# Patient Record
Sex: Female | Born: 1950
Health system: Southern US, Community
[De-identification: ages and names within clinical notes are randomized; demographics above are authoritative.]

## PROBLEM LIST (undated history)

## (undated) DIAGNOSIS — E785 Hyperlipidemia, unspecified: Secondary | ICD-10-CM

## (undated) DIAGNOSIS — I10 Essential (primary) hypertension: Secondary | ICD-10-CM

## (undated) DIAGNOSIS — I739 Peripheral vascular disease, unspecified: Secondary | ICD-10-CM

## (undated) DIAGNOSIS — K219 Gastro-esophageal reflux disease without esophagitis: Secondary | ICD-10-CM

## (undated) HISTORY — PX: TUBAL LIGATION: SHX77

---

## 2006-04-23 LAB — HM COLONOSCOPY: HM COLON: NORMAL

## 2006-08-05 ENCOUNTER — Ambulatory Visit: Payer: Self-pay | Admitting: Gastroenterology

## 2006-08-05 HISTORY — PX: COLONOSCOPY: SHX174

## 2006-09-26 ENCOUNTER — Ambulatory Visit: Payer: Self-pay | Admitting: Emergency Medicine

## 2007-11-02 ENCOUNTER — Ambulatory Visit: Payer: Self-pay | Admitting: Family Medicine

## 2010-08-16 ENCOUNTER — Ambulatory Visit: Payer: Self-pay | Admitting: Internal Medicine

## 2011-08-24 ENCOUNTER — Ambulatory Visit: Payer: Self-pay | Admitting: Internal Medicine

## 2012-10-01 ENCOUNTER — Ambulatory Visit: Payer: Self-pay | Admitting: Internal Medicine

## 2012-10-07 ENCOUNTER — Ambulatory Visit: Payer: Self-pay | Admitting: Internal Medicine

## 2013-10-21 LAB — HM MAMMOGRAPHY: HM Mammogram: NORMAL

## 2013-11-19 ENCOUNTER — Ambulatory Visit: Payer: Self-pay | Admitting: Internal Medicine

## 2014-10-22 ENCOUNTER — Encounter: Payer: Self-pay | Admitting: Internal Medicine

## 2014-10-22 DIAGNOSIS — IMO0001 Reserved for inherently not codable concepts without codable children: Secondary | ICD-10-CM | POA: Insufficient documentation

## 2014-10-22 DIAGNOSIS — I10 Essential (primary) hypertension: Secondary | ICD-10-CM | POA: Insufficient documentation

## 2014-10-22 DIAGNOSIS — J3089 Other allergic rhinitis: Secondary | ICD-10-CM | POA: Insufficient documentation

## 2014-10-22 DIAGNOSIS — E782 Mixed hyperlipidemia: Secondary | ICD-10-CM | POA: Insufficient documentation

## 2014-12-10 ENCOUNTER — Encounter: Payer: Self-pay | Admitting: Internal Medicine

## 2015-02-22 ENCOUNTER — Encounter: Payer: Self-pay | Admitting: Internal Medicine

## 2015-02-23 ENCOUNTER — Encounter: Payer: Self-pay | Admitting: Internal Medicine

## 2015-02-23 ENCOUNTER — Ambulatory Visit (INDEPENDENT_AMBULATORY_CARE_PROVIDER_SITE_OTHER): Payer: Medicare HMO | Admitting: Internal Medicine

## 2015-02-23 VITALS — BP 145/80 | HR 64 | Ht 60.5 in | Wt 128.5 lb

## 2015-02-23 DIAGNOSIS — F17200 Nicotine dependence, unspecified, uncomplicated: Secondary | ICD-10-CM

## 2015-02-23 DIAGNOSIS — Z1239 Encounter for other screening for malignant neoplasm of breast: Secondary | ICD-10-CM | POA: Diagnosis not present

## 2015-02-23 DIAGNOSIS — N63 Unspecified lump in breast: Secondary | ICD-10-CM | POA: Diagnosis not present

## 2015-02-23 DIAGNOSIS — I739 Peripheral vascular disease, unspecified: Secondary | ICD-10-CM

## 2015-02-23 DIAGNOSIS — I10 Essential (primary) hypertension: Secondary | ICD-10-CM | POA: Diagnosis not present

## 2015-02-23 DIAGNOSIS — Z0000 Encounter for general adult medical examination without abnormal findings: Secondary | ICD-10-CM | POA: Diagnosis not present

## 2015-02-23 DIAGNOSIS — E7849 Other hyperlipidemia: Secondary | ICD-10-CM

## 2015-02-23 DIAGNOSIS — E784 Other hyperlipidemia: Secondary | ICD-10-CM | POA: Diagnosis not present

## 2015-02-23 DIAGNOSIS — N6310 Unspecified lump in the right breast, unspecified quadrant: Secondary | ICD-10-CM

## 2015-02-23 HISTORY — DX: Nicotine dependence, unspecified, uncomplicated: F17.200

## 2015-02-23 LAB — POCT URINALYSIS DIPSTICK
BILIRUBIN UA: NEGATIVE
Blood, UA: NEGATIVE
GLUCOSE UA: NEGATIVE
Ketones, UA: NEGATIVE
Leukocytes, UA: NEGATIVE
Nitrite, UA: NEGATIVE
Protein, UA: NEGATIVE
Spec Grav, UA: 1.02
UROBILINOGEN UA: 0.2
pH, UA: 5

## 2015-02-23 MED ORDER — LISINOPRIL 10 MG PO TABS: 10.0000 mg | ORAL_TABLET | Freq: Every day | ORAL | Status: DC

## 2015-02-23 NOTE — Progress Notes (Signed)
Patient: Angela Herrera, Female    DOB: 24-Nov-1950, 64 y.o.   MRN: [id number] Visit Date: 02/23/2015  Today's Provider: Halina Maidens, MD   Chief Complaint  Patient presents with  . Medicare Wellness    initial visit  . Hypertension   Subjective:   Initial preventative physical exam Angela Herrera is a 64 y.o. female who presents today for her Initial Preventative Physical Exam. She feels well. She reports exercising walking daily. She reports she is sleeping fairly well. Still having some night sweats.She denies any breast problems.  Hypertension This is a chronic problem. The current episode started more than 1 year ago. The problem is unchanged. The problem is controlled. Pertinent negatives include no chest pain, headaches, palpitations or shortness of breath. The current treatment provides significant improvement. There are no compliance problems.  There is no history of kidney disease or heart failure. There is no history of chronic renal disease.  Peripheral vascular disease - She has minimal symptoms of lower extremity pain.  She continues to smoke but is cutting back. She remains active walking daily.  She has been seen by vascular surgery and no surgery recommended.  Review of Systems  Constitutional: Negative for fever, chills and fatigue.  HENT: Negative for hearing loss, sinus pressure, trouble swallowing and voice change.   Eyes: Negative for visual disturbance.  Respiratory: Negative for cough, chest tightness and shortness of breath.   Cardiovascular: Negative for chest pain, palpitations and leg swelling.  Gastrointestinal: Negative for nausea, abdominal pain, diarrhea and blood in stool.  Genitourinary: Negative for dysuria, urgency and vaginal discharge.  Musculoskeletal: Negative for joint swelling, arthralgias and gait problem.  Neurological: Negative for weakness, light-headedness, numbness and headaches.  Hematological: Negative for adenopathy.   Psychiatric/Behavioral: Positive for sleep disturbance (night sweats intermittently). Negative for dysphoric mood and decreased concentration.    Social History   Social History  . Marital Status: Married    Spouse Name: N/A  . Number of Children: N/A  . Years of Education: N/A   Occupational History  . Not on file.   Social History Main Topics  . Smoking status: Current Every Day Smoker  . Smokeless tobacco: Not on file  . Alcohol Use: 0.0 oz/week    0 Standard drinks or equivalent per week  . Drug Use: No  . Sexual Activity: Not on file   Other Topics Concern  . Not on file   Social History Narrative    Patient Active Problem List   Diagnosis Date Noted  . Familial multiple lipoprotein-type hyperlipidemia 10/22/2014  . Environmental and seasonal allergies 10/22/2014  . Charcot's syndrome ([place]) 10/22/2014  . Hypertension 10/22/2014    Past Surgical History  Procedure Laterality Date  . Tubal ligation Bilateral     Her family history includes Hypertension in her mother; Stroke in her mother.    Previous Medications   ASPIRIN 81 MG TABLET    Take 81 mg by mouth daily.   VITAMIN B-12 (CYANOCOBALAMIN) 1000 MCG TABLET    Take 1,000 mcg by mouth daily.    Patient Care Team: Glean Hess, MD as PCP - General (Family Medicine)     Objective:   Vitals: BP 145/80 mmHg  Pulse 64  Ht 5' 0.5" (1.537 m)  Wt 128 lb 8 oz (58.287 kg)  BMI 24.67 kg/m2  Physical Exam  Constitutional: She is oriented to person, place, and time. She appears well-developed and well-nourished. No distress.  HENT:  Head: Normocephalic and atraumatic.  Right Ear: Tympanic membrane and ear canal normal.  Left Ear: Tympanic membrane and ear canal normal.  Nose: Right sinus exhibits no maxillary sinus tenderness. Left sinus exhibits no maxillary sinus tenderness.  Mouth/Throat: Uvula is midline and oropharynx is clear and moist.  Eyes: Conjunctivae and EOM are normal. Right eye exhibits  no discharge. Left eye exhibits no discharge. No scleral icterus.  Neck: Normal range of motion. Carotid bruit is not present. No erythema present. No thyromegaly present.  Cardiovascular: Normal rate, regular rhythm and normal heart sounds.   Pulses:      Dorsalis pedis pulses are 1+ on the right side, and 1+ on the left side.       Posterior tibial pulses are 1+ on the right side, and 1+ on the left side.  Pulmonary/Chest: Effort normal. No respiratory distress. She has no wheezes. Right breast exhibits mass (at 5,6,7 oclock). Right breast exhibits no nipple discharge, no skin change and no tenderness. Left breast exhibits no mass, no nipple discharge, no skin change and no tenderness.  Abdominal: Soft. Bowel sounds are normal. There is no hepatosplenomegaly. There is no tenderness. There is no CVA tenderness.  Musculoskeletal: Normal range of motion.  Lymphadenopathy:    She has no cervical adenopathy.    She has no axillary adenopathy.  Neurological: She is alert and oriented to person, place, and time. She has normal strength and normal reflexes. No cranial nerve deficit or sensory deficit.  Skin: Skin is warm, dry and intact. No rash noted.  Psychiatric: She has a normal mood and affect. Her speech is normal and behavior is normal. Thought content normal. Cognition and memory are normal.  Nursing note and vitals reviewed.    No exam data present  Activities of Daily Living In your present state of health, do you have any difficulty performing the following activities: 02/23/2015  Hearing? N  Vision? N  Difficulty concentrating or making decisions? N  Walking or climbing stairs? N  Dressing or bathing? N  Doing errands, shopping? N    Fall Risk Assessment Fall Risk  02/23/2015  Falls in the past year? No     Patient reports there are safety devices in place in shower at home.   Depression Screen PHQ 2/9 Scores 02/23/2015  PHQ - 2 Score 0    Cognitive Testing - 6-CIT    Correct? Score   What year is it? yes 0 Yes = 0    No = 4  What month is it? yes 0 Yes = 0    No = 3  Remember:     Pia [address]au, [place], [place]     What time is it? yes 0 Yes = 0    No = 3  Count backwards from 20 to 1 yes 0 Correct = 0    1 error = 2   More than 1 error = 4  Say the months of the year in reverse. yes 0 Correct = 0    1 error = 2   More than 1 error = 4  What address did I ask you to remember? yes 0 Correct = 0  1 error = 2    2 error = 4    3 error = 6    4 error = 8    All wrong = 10       TOTAL SCORE  0/28   Interpretation:  Normal  Normal (0-7) Abnormal (8-28)  Assessment & Plan:     Initial Preventative Physical Exam  Reviewed patient's Family Medical History Reviewed and updated list of patient's medical providers Assessment of cognitive impairment was done Assessed patient's functional ability Established a written schedule for health screening [place] Completed and Reviewed  Exercise Activities and Dietary recommendations Goals    None      Immunization History  Administered Date(s) Administered  . Pneumococcal-Unspecified 09/24/2012  . Zoster 12/08/2013    Health Maintenance  Topic Date Due  . Hepatitis C Screening  February 08, 1951  . HIV Screening  06/25/1964  . TETANUS/TDAP  06/25/1968  . PAP SMEAR  06/26/1970  . DEXA SCAN  06/26/2014  . PNA vac Low Risk Adult (1 of 2 - PCV13) 06/26/2014  . INFLUENZA VACCINE  11/22/2014  . MAMMOGRAM  10/22/2015  . COLONOSCOPY  04/23/2016  . ZOSTAVAX  Completed      Discussed health benefits of physical activity, and encouraged her to engage in regular exercise appropriate for her age and condition.    ------------------------------------------------------------------------------------------------------------  1. Medicare annual wellness visit, initial Patient is given information regarding advanced directives She'll call her insurance for coverage on tetanus and  Prevnar vaccines - POCT urinalysis dipstick - EKG 12-Lead - SR with borderline voltage criteria for LVH  2. Essential hypertension Fair control; continue current medication - CBC with Differential/Platelet - Comprehensive metabolic panel - TSH - lisinopril (PRINIVIL,ZESTRIL) 10 MG tablet; Take 1 tablet (10 mg total) by mouth daily.  Dispense: 90 tablet; Refill: 3  3. Familial multiple lipoprotein-type hyperlipidemia Continue healthy diet and regular exercise - Lipid panel  4. Mass of breast, right Noted on exam today - MM Digital Diagnostic Bilat; Future - US BREAST LTD UNI RIGHT INC AXILLA; Future - US BREAST LTD UNI LEFT INC AXILLA; Future  6. Peripheral vascular disease ([place]) Seen by Vascular surgery - only recommended exercise Pt is not ready to quit smoking at this time Continue aspirin  Halina Maidens, MD [facility]  02/23/2015

## 2015-02-23 NOTE — Patient Instructions (Addendum)
Health Maintenance  Topic Date Due  . Hepatitis C Screening  1951/01/05  . HIV Screening  06/25/1964  . TETANUS/TDAP  Due now  . PAP SMEAR  No longer needed  . DEXA SCAN  06/26/2014  . PNA vac Low Risk Adult (1 of 2 - PCV13) Prevar-13 due now  . INFLUENZA VACCINE  11/22/2014  . MAMMOGRAM  Yearly  . COLONOSCOPY  04/23/2016  . ZOSTAVAX  Completed   Breast Self-Awareness Practicing breast self-awareness may pick up problems early, prevent significant medical complications, and possibly save your life. By practicing breast self-awareness, you can become familiar with how your breasts look and feel and if your breasts are changing. This allows you to notice changes early. It can also offer you some reassurance that your breast health is good. One way to learn what is normal for your breasts and whether your breasts are changing is to do a breast self-exam. If you find a lump or something that was not present in the past, it is best to contact your caregiver right away. Other findings that should be evaluated by your caregiver include nipple discharge, especially if it is bloody; skin changes or reddening; areas where the skin seems to be pulled in (retracted); or new lumps and bumps. Breast pain is seldom associated with cancer (malignancy), but should also be evaluated by a caregiver. HOW TO PERFORM A BREAST SELF-EXAM The best time to examine your breasts is 5-7 days after your menstrual period is over. During menstruation, the breasts are lumpier, and it may be more difficult to pick up changes. If you do not menstruate, have reached menopause, or had your uterus removed (hysterectomy), you should examine your breasts at regular intervals, such as monthly. If you are breastfeeding, examine your breasts after a feeding or after using a breast pump. Breast implants do not decrease the risk for lumps or tumors, so continue to perform breast self-exams as recommended. Talk to your caregiver about how to  determine the difference between the implant and breast tissue. Also, talk about the amount of pressure you should use during the exam. Over time, you will become more familiar with the variations of your breasts and more comfortable with the exam. A breast self-exam requires you to remove all your clothes above the waist. 1. Look at your breasts and nipples. Stand in front of a mirror in a room with good lighting. With your hands on your hips, push your hands firmly downward. Look for a difference in shape, contour, and size from one breast to the other (asymmetry). Asymmetry includes puckers, dips, or bumps. Also, look for skin changes, such as reddened or scaly areas on the breasts. Look for nipple changes, such as discharge, dimpling, repositioning, or redness. 2. Carefully feel your breasts. This is best done either in the shower or tub while using soapy water or when flat on your back. Place the arm (on the side of the breast you are examining) above your head. Use the pads (not the fingertips) of your three middle fingers on your opposite hand to feel your breasts. Start in the underarm area and use  inch (2 cm) overlapping circles to feel your breast. Use 3 different levels of pressure (light, medium, and firm pressure) at each circle before moving to the next circle. The light pressure is needed to feel the tissue closest to the skin. The medium pressure will help to feel breast tissue a little deeper, while the firm pressure is needed to feel  the tissue close to the ribs. Continue the overlapping circles, moving downward over the breast until you feel your ribs below your breast. Then, move one finger-width towards the center of the body. Continue to use the  inch (2 cm) overlapping circles to feel your breast as you move slowly up toward the collar bone (clavicle) near the base of the neck. Continue the up and down exam using all 3 pressures until you reach the middle of the chest. Do this with each  breast, carefully feeling for lumps or changes. 3.  Keep a written record with breast changes or normal findings for each breast. By writing this information down, you do not need to depend only on memory for size, tenderness, or location. Write down where you are in your menstrual cycle, if you are still menstruating. Breast tissue can have some lumps or thick tissue. However, see your caregiver if you find anything that concerns you.  SEEK MEDICAL CARE IF:  You see a change in shape, contour, or size of your breasts or nipples.   You see skin changes, such as reddened or scaly areas on the breasts or nipples.   You have an unusual discharge from your nipples.   You feel a new lump or unusually thick areas.    This information is not intended to replace advice given to you by your health care provider. Make sure you discuss any questions you have with your health care provider.   Document Released: 04/09/2005 Document Revised: 03/26/2012 Document Reviewed: 07/25/2011 Elsevier Interactive Patient Education [employer].

## 2015-02-24 ENCOUNTER — Other Ambulatory Visit: Payer: Self-pay | Admitting: Internal Medicine

## 2015-02-24 LAB — LIPID PANEL
CHOL/HDL RATIO: 4.9 ratio — AB (ref 0.0–4.4)
Cholesterol, Total: 253 mg/dL — ABNORMAL HIGH (ref 100–199)
HDL: 52 mg/dL (ref >39)
LDL CALC: 169 mg/dL — AB (ref 0–99)
TRIGLYCERIDES: 160 mg/dL — AB (ref 0–149)
VLDL Cholesterol Cal: 32 mg/dL (ref 5–40)

## 2015-02-24 LAB — COMPREHENSIVE METABOLIC PANEL
ALT: 9 IU/L (ref 0–32)
AST: 15 IU/L (ref 0–40)
Albumin/Globulin Ratio: 1.7 (ref 1.1–2.5)
Albumin: 4.3 g/dL (ref 3.6–4.8)
Alkaline Phosphatase: 61 IU/L (ref 39–117)
BUN/Creatinine Ratio: 12 (ref 11–26)
BUN: 8 mg/dL (ref 8–27)
Bilirubin Total: 0.4 mg/dL (ref 0.0–1.2)
CALCIUM: 9.6 mg/dL (ref 8.7–10.3)
CO2: 27 mmol/L (ref 18–29)
Chloride: 99 mmol/L (ref 97–106)
Creatinine, Ser: 0.69 mg/dL (ref 0.57–1.00)
GFR, EST AFRICAN AMERICAN: 106 mL/min/{1.73_m2} (ref >59)
GFR, EST NON AFRICAN AMERICAN: 92 mL/min/{1.73_m2} (ref >59)
GLOBULIN, TOTAL: 2.6 g/dL (ref 1.5–4.5)
Glucose: 86 mg/dL (ref 65–99)
Potassium: 4.6 mmol/L (ref 3.5–5.2)
SODIUM: 141 mmol/L (ref 136–144)
TOTAL PROTEIN: 6.9 g/dL (ref 6.0–8.5)

## 2015-02-24 LAB — CBC WITH DIFFERENTIAL/PLATELET
BASOS: 0 %
Basophils Absolute: 0 10*3/uL (ref 0.0–0.2)
EOS (ABSOLUTE): 0.4 10*3/uL (ref 0.0–0.4)
EOS: 6 %
HEMATOCRIT: 41.1 % (ref 34.0–46.6)
Hemoglobin: 13.2 g/dL (ref 11.1–15.9)
IMMATURE GRANS (ABS): 0 10*3/uL (ref 0.0–0.1)
IMMATURE GRANULOCYTES: 0 %
LYMPHS: 36 %
Lymphocytes Absolute: 2.2 10*3/uL (ref 0.7–3.1)
MCH: 29 pg (ref 26.6–33.0)
MCHC: 32.1 g/dL (ref 31.5–35.7)
MCV: 90 fL (ref 79–97)
MONOCYTES: 12 %
MONOS ABS: 0.7 10*3/uL (ref 0.1–0.9)
Neutrophils Absolute: 2.7 10*3/uL (ref 1.4–7.0)
Neutrophils: 46 %
Platelets: 293 10*3/uL (ref 150–379)
RBC: 4.55 x10E6/uL (ref 3.77–5.28)
RDW: 15.1 % (ref 12.3–15.4)
WBC: 6 10*3/uL (ref 3.4–10.8)

## 2015-02-24 LAB — TSH: TSH: 1.77 u[IU]/mL (ref 0.450–4.500)

## 2015-02-24 MED ORDER — ATORVASTATIN CALCIUM 20 MG PO TABS: 20.0000 mg | ORAL_TABLET | Freq: Every day | ORAL | Status: DC

## 2015-03-10 ENCOUNTER — Ambulatory Visit
Admission: RE | Admit: 2015-03-10 | Discharge: 2015-03-10 | Disposition: A | Discharge status: Home / Self Care | Payer: Medicare HMO | Source: Ambulatory Visit | Attending: Internal Medicine | Admitting: Internal Medicine

## 2015-03-10 ENCOUNTER — Other Ambulatory Visit: Payer: Self-pay | Admitting: Internal Medicine

## 2015-03-10 DIAGNOSIS — R928 Other abnormal and inconclusive findings on diagnostic imaging of breast: Secondary | ICD-10-CM | POA: Insufficient documentation

## 2015-03-10 DIAGNOSIS — N6310 Unspecified lump in the right breast, unspecified quadrant: Secondary | ICD-10-CM

## 2015-03-10 DIAGNOSIS — N63 Unspecified lump in breast: Secondary | ICD-10-CM | POA: Diagnosis present

## 2015-08-23 ENCOUNTER — Ambulatory Visit: Payer: Medicare HMO | Admitting: Internal Medicine

## 2015-09-14 ENCOUNTER — Ambulatory Visit (INDEPENDENT_AMBULATORY_CARE_PROVIDER_SITE_OTHER): Payer: Medicare HMO | Admitting: Internal Medicine

## 2015-09-14 ENCOUNTER — Encounter: Payer: Self-pay | Admitting: Internal Medicine

## 2015-09-14 VITALS — BP 124/80 | HR 62 | Ht 64.0 in | Wt 126.0 lb

## 2015-09-14 DIAGNOSIS — K219 Gastro-esophageal reflux disease without esophagitis: Secondary | ICD-10-CM | POA: Insufficient documentation

## 2015-09-14 DIAGNOSIS — I10 Essential (primary) hypertension: Secondary | ICD-10-CM | POA: Diagnosis not present

## 2015-09-14 DIAGNOSIS — E7849 Other hyperlipidemia: Secondary | ICD-10-CM

## 2015-09-14 DIAGNOSIS — E784 Other hyperlipidemia: Secondary | ICD-10-CM | POA: Diagnosis not present

## 2015-09-14 DIAGNOSIS — I739 Peripheral vascular disease, unspecified: Secondary | ICD-10-CM

## 2015-09-14 MED ORDER — ATORVASTATIN CALCIUM 20 MG PO TABS: 20.0000 mg | ORAL_TABLET | Freq: Every day | ORAL | Status: DC

## 2015-09-14 MED ORDER — OMEPRAZOLE 40 MG PO CPDR: 40.0000 mg | DELAYED_RELEASE_CAPSULE | Freq: Every day | ORAL | Status: DC

## 2015-09-14 NOTE — Progress Notes (Signed)
Date:  09/14/2015   Name:  Angela Herrera   DOB:  20-Dec-1950   MRN:  [id number]   Chief Complaint: Hypertension and Hyperlipidemia Hypertension This is a chronic problem. The current episode started more than 1 year ago. The problem is unchanged. The problem is controlled. Associated symptoms include chest pain. Pertinent negatives include no palpitations or shortness of breath. The current treatment provides significant improvement. There are no compliance problems.  There is no history of kidney disease or CAD/MI.  Hyperlipidemia Associated symptoms include chest pain. Pertinent negatives include no shortness of breath.  Gastroesophageal Reflux She complains of chest pain and heartburn. She reports no coughing or no wheezing. This is a new problem. The problem occurs occasionally. The heartburn wakes her from sleep. She has tried a histamine-2 antagonist for the symptoms.    Review of Systems  Eyes: Negative for visual disturbance.  Respiratory: Negative for cough, chest tightness, shortness of breath and wheezing.   Cardiovascular: Positive for chest pain. Negative for palpitations and leg swelling.  Gastrointestinal: Positive for heartburn. Negative for constipation and blood in stool.    Patient Active Problem List   Diagnosis Date Noted  . Peripheral vascular disease ([place]) 02/23/2015  . Tobacco use disorder 02/23/2015  . Familial multiple lipoprotein-type hyperlipidemia 10/22/2014  . Environmental and seasonal allergies 10/22/2014  . Charcot's syndrome ([place]) 10/22/2014  . Hypertension 10/22/2014    Prior to Admission medications   Medication Sig Start Date End Date Taking? Authorizing Provider  aspirin 81 MG tablet Take 81 mg by mouth daily.   Yes Historical Provider, MD  atorvastatin (LIPITOR) 20 MG tablet Take 1 tablet (20 mg total) by mouth daily. 02/24/15  Yes Glean Hess, MD  calcium carbonate (OSCAL) 1500 (600 Ca) MG TABS tablet Take 1 tablet by mouth daily  with breakfast.   Yes Historical Provider, MD  lisinopril (PRINIVIL,ZESTRIL) 10 MG tablet Take 1 tablet (10 mg total) by mouth daily. 02/23/15  Yes Glean Hess, MD  vitamin B-12 (CYANOCOBALAMIN) 1000 MCG tablet Take 1,000 mcg by mouth daily.   Yes Historical Provider, MD    Allergies  Allergen Reactions  . Tetracycline Itching    Past Surgical History  Procedure Laterality Date  . Tubal ligation Bilateral     Social History  Substance Use Topics  . Smoking status: Current Every Day Smoker  . Smokeless tobacco: None  . Alcohol Use: 0.0 oz/week    0 Standard drinks or equivalent per week     Medication list has been reviewed and updated.   Physical Exam  Constitutional: She is oriented to person, place, and time. She appears well-developed. No distress.  HENT:  Head: Normocephalic and atraumatic.  Neck: Normal range of motion. Neck supple. No thyromegaly present.  Cardiovascular: Normal rate, regular rhythm and normal heart sounds.   Pulmonary/Chest: Effort normal and breath sounds normal. No respiratory distress. She has no wheezes.  Abdominal: Soft. Normal appearance and bowel sounds are normal. She exhibits no mass. There is no tenderness.  Musculoskeletal: Normal range of motion.  Neurological: She is alert and oriented to person, place, and time.  Skin: Skin is warm and dry. No rash noted.  Psychiatric: She has a normal mood and affect. Her behavior is normal. Thought content normal.    BP 124/80 mmHg  Pulse 62  Ht 5\' 4"  (1.626 m)  Wt 126 lb (57.153 kg)  BMI 21.62 kg/m2  Assessment and Plan: 1. Gastroesophageal reflux disease, esophagitis presence not  specified - omeprazole (PRILOSEC) 40 MG capsule; Take 1 capsule (40 mg total) by mouth daily.  Dispense: 90 capsule; Refill: 3 - H. pylori antibody, IgG Call if symptoms persist  2. Essential hypertension controlled  3. Familial multiple lipoprotein-type hyperlipidemia Now on statin therapy without side  effects Will check CMP - atorvastatin (LIPITOR) 20 MG tablet; Take 1 tablet (20 mg total) by mouth daily.  Dispense: 90 tablet; Refill: 1  4. Peripheral vascular disease ([place]) Stable Smoking cessation discussed - pt has cut back to 2-3 per day and will eventually stop completely   Halina Maidens, MD [facility]  09/14/2015

## 2015-09-14 NOTE — Patient Instructions (Signed)

## 2015-09-15 LAB — H. PYLORI ANTIBODY, IGG: H Pylori IgG: <0.9 U/mL (ref 0.0–0.8)

## 2015-09-16 LAB — COMPREHENSIVE METABOLIC PANEL
A/G RATIO: 1.7 (ref 1.2–2.2)
ALBUMIN: 4.3 g/dL (ref 3.6–4.8)
ALK PHOS: 61 IU/L (ref 39–117)
ALT: 11 IU/L (ref 0–32)
AST: 18 IU/L (ref 0–40)
BILIRUBIN TOTAL: 0.2 mg/dL (ref 0.0–1.2)
BUN / CREAT RATIO: 13 (ref 12–28)
BUN: 9 mg/dL (ref 8–27)
CHLORIDE: 99 mmol/L (ref 96–106)
CO2: 22 mmol/L (ref 18–29)
Calcium: 9.6 mg/dL (ref 8.7–10.3)
Creatinine, Ser: 0.69 mg/dL (ref 0.57–1.00)
GFR calc non Af Amer: 91 mL/min/{1.73_m2} (ref >59)
GFR, EST AFRICAN AMERICAN: 105 mL/min/{1.73_m2} (ref >59)
GLOBULIN, TOTAL: 2.5 g/dL (ref 1.5–4.5)
Glucose: 76 mg/dL (ref 65–99)
POTASSIUM: 4.8 mmol/L (ref 3.5–5.2)
SODIUM: 142 mmol/L (ref 134–144)
TOTAL PROTEIN: 6.8 g/dL (ref 6.0–8.5)

## 2015-09-16 LAB — SPECIMEN STATUS REPORT

## 2016-01-18 DIAGNOSIS — H25013 Cortical age-related cataract, bilateral: Secondary | ICD-10-CM | POA: Diagnosis not present

## 2016-02-21 ENCOUNTER — Other Ambulatory Visit: Payer: Self-pay | Admitting: Internal Medicine

## 2016-02-21 DIAGNOSIS — Z1231 Encounter for screening mammogram for malignant neoplasm of breast: Secondary | ICD-10-CM

## 2016-02-28 ENCOUNTER — Ambulatory Visit (INDEPENDENT_AMBULATORY_CARE_PROVIDER_SITE_OTHER): Payer: Medicare HMO | Admitting: Internal Medicine

## 2016-02-28 ENCOUNTER — Encounter: Payer: Self-pay | Admitting: Internal Medicine

## 2016-02-28 VITALS — BP 160/86 | HR 60 | Resp 16 | Ht 64.0 in | Wt 127.0 lb

## 2016-02-28 DIAGNOSIS — K219 Gastro-esophageal reflux disease without esophagitis: Secondary | ICD-10-CM | POA: Diagnosis not present

## 2016-02-28 DIAGNOSIS — I739 Peripheral vascular disease, unspecified: Secondary | ICD-10-CM | POA: Diagnosis not present

## 2016-02-28 DIAGNOSIS — Z1159 Encounter for screening for other viral diseases: Secondary | ICD-10-CM | POA: Diagnosis not present

## 2016-02-28 DIAGNOSIS — Z23 Encounter for immunization: Secondary | ICD-10-CM | POA: Diagnosis not present

## 2016-02-28 DIAGNOSIS — I10 Essential (primary) hypertension: Secondary | ICD-10-CM

## 2016-02-28 DIAGNOSIS — E7849 Other hyperlipidemia: Secondary | ICD-10-CM

## 2016-02-28 DIAGNOSIS — R69 Illness, unspecified: Secondary | ICD-10-CM | POA: Diagnosis not present

## 2016-02-28 DIAGNOSIS — F17200 Nicotine dependence, unspecified, uncomplicated: Secondary | ICD-10-CM

## 2016-02-28 DIAGNOSIS — E784 Other hyperlipidemia: Secondary | ICD-10-CM | POA: Diagnosis not present

## 2016-02-28 LAB — POCT URINALYSIS DIPSTICK
Bilirubin, UA: NEGATIVE
Glucose, UA: NEGATIVE
KETONES UA: NEGATIVE
Leukocytes, UA: NEGATIVE
Nitrite, UA: NEGATIVE
PH UA: 7
PROTEIN UA: NEGATIVE
RBC UA: NEGATIVE
SPEC GRAV UA: 1.015
UROBILINOGEN UA: 0.2

## 2016-02-28 MED ORDER — AMLODIPINE BESYLATE 5 MG PO TABS: 5.0000 mg | ORAL_TABLET | Freq: Every day | ORAL | 5 refills | Status: DC

## 2016-02-28 NOTE — Patient Instructions (Addendum)
Pneumococcal Conjugate Vaccine (PCV13)  1. Why get vaccinated? Vaccination can protect both children and adults from pneumococcal disease. Pneumococcal disease is caused by bacteria that can spread from person to person through close contact. It can cause ear infections, and it can also lead to more serious infections of the:  Lungs (pneumonia),  Blood (bacteremia), and  Covering of the brain and spinal cord (meningitis). Pneumococcal pneumonia is most common among adults. Pneumococcal meningitis can cause deafness and brain damage, and it kills about 1 child in 10 who get it. Anyone can get pneumococcal disease, but children under 23 years of age and adults 24 years and older, people with certain medical conditions, and cigarette smokers are at the highest risk. Before there was a vaccine, the [place] saw:  more than 700 cases of meningitis,  about 13,000 blood infections,  about 5 million ear infections, and  about 200 deaths in children under 5 each year from pneumococcal disease. Since vaccine became available, severe pneumococcal disease in these children has fallen by 88%. About 18,000 older adults die of pneumococcal disease each year in the [place]. Treatment of pneumococcal infections with penicillin and other drugs is not as effective as it used to be, because some strains of the disease have become resistant to these drugs. This makes prevention of the disease, through vaccination, even more important. 2. PCV13 vaccine Pneumococcal conjugate vaccine (called PCV13) protects against 13 types of pneumococcal bacteria. PCV13 is routinely given to children at 2, 4, 6, and 61-46 months of age. It is also recommended for children and adults 76 to 55 years of age with certain health conditions, and for all adults 45 years of age and older. Your doctor can give you details. 3. Some people should not get this vaccine Anyone who has ever had a life-threatening allergic  reaction to a dose of this vaccine, to an earlier pneumococcal vaccine called PCV7, or to any vaccine containing diphtheria toxoid (for example, DTaP), should not get PCV13. Anyone with a severe allergy to any component of PCV13 should not get the vaccine. Tell your doctor if the person being vaccinated has any severe allergies. If the person scheduled for vaccination is not feeling well, your healthcare provider might decide to reschedule the shot on another day. 4. Risks of a vaccine reaction With any medicine, including vaccines, there is a chance of reactions. These are usually mild and go away on their own, but serious reactions are also possible. Problems reported following PCV13 varied by age and dose in the series. The most common problems reported among children were:  About half became drowsy after the shot, had a temporary loss of appetite, or had redness or tenderness where the shot was given.  About 1 out of 3 had swelling where the shot was given.  About 1 out of 3 had a mild fever, and about 1 in 20 had a fever over 102.28F.  Up to about 8 out of 10 became fussy or irritable. Adults have reported pain, redness, and swelling where the shot was given; also mild fever, fatigue, headache, chills, or muscle pain. Young children who get PCV13 along with inactivated flu vaccine at the same time may be at increased risk for seizures caused by fever. Ask your doctor for more information. Problems that could happen after any vaccine:  People sometimes faint after a medical procedure, including vaccination. Sitting or lying down for about 15 minutes can help prevent fainting, and injuries caused by a fall.  Tell your doctor if you feel dizzy, or have vision changes or ringing in the ears.  Some older children and adults get severe pain in the shoulder and have difficulty moving the arm where a shot was given. This happens very rarely.  Any medication can cause a severe allergic reaction.  Such reactions from a vaccine are very rare, estimated at about 1 in a million doses, and would happen within a few minutes to a few hours after the vaccination. As with any medicine, there is a very small chance of a vaccine causing a serious injury or death. The safety of vaccines is always being monitored. For more information, visit: http://www.aguilar.org/ 5. What if there is a serious reaction? What should I look for?  Look for anything that concerns you, such as signs of a severe allergic reaction, very high fever, or unusual behavior. Signs of a severe allergic reaction can include hives, swelling of the face and throat, difficulty breathing, a fast heartbeat, dizziness, and weakness-usually within a few minutes to a few hours after the vaccination. What should I do?  If you think it is a severe allergic reaction or other emergency that can't wait, call 9-1-1 or get the person to the nearest hospital. Otherwise, call your doctor. Reactions should be reported to the Vaccine Adverse Event Reporting System (VAERS). Your doctor should file this report, or you can do it yourself through the VAERS web site at www.vaers.SamedayNews.es, or by calling 854-494-0196. VAERS does not give medical advice. 6. The National Vaccine Injury Compensation Program The [employer] Vaccine Injury Compensation Program (VICP) is a federal program that was created to compensate people who may have been injured by certain vaccines. Persons who believe they may have been injured by a vaccine can learn about the program and about filing a claim by calling (567) 141-3053 or visiting the [place] website at GoldCloset.com.ee. There is a time limit to file a claim for compensation. 7. How can I learn more?  Ask your healthcare provider. He or she can give you the vaccine package insert or suggest other sources of information.  Call your local or state health department.  Contact the Centers for Disease Control  and Prevention (CDC):  Call (651)313-5745 (1-800-CDC-INFO) or  Visit CDC's website at http://hunter.com/ Vaccine Information Statement PCV13 Vaccine (02/25/2014)   This information is not intended to replace advice given to you by your health care provider. Make sure you discuss any questions you have with your health care provider.   Document Released: 02/04/2006 Document Revised: 04/30/2014 Document Reviewed: 03/04/2014 Elsevier Interactive Patient Education 2016 [place] Maintenance  Topic Date Due  . Hepatitis C Screening  Aug 08, 1950  . TETANUS/TDAP  06/25/1968  . DEXA SCAN  06/26/2014  . PNA vac Low Risk Adult (1 of 2 - PCV13) 06/26/2014  . INFLUENZA VACCINE  01/21/2017 (Originally 11/22/2015)  . COLONOSCOPY  04/23/2016  . MAMMOGRAM  03/09/2017  . ZOSTAVAX  Completed

## 2016-02-28 NOTE — Progress Notes (Signed)
Date:  02/28/2016   Name:  Angela Herrera   DOB:  [birth year]   MRN:  [id number]   Chief Complaint: Hypertension Hypertension  This is a chronic problem. The current episode started more than 1 year ago. The problem is controlled. Pertinent negatives include no chest pain, headaches, palpitations or shortness of breath.  Gastroesophageal Reflux  She complains of heartburn. She reports no abdominal pain, no chest pain, no choking, no coughing, no dysphagia or no wheezing. This is a chronic problem. The problem occurs occasionally. Pertinent negatives include no fatigue. Risk factors include smoking/tobacco exposure. She has tried a PPI for the symptoms.  Hyperlipidemia  This is a chronic problem. The problem is controlled. Pertinent negatives include no chest pain or shortness of breath. Current antihyperlipidemic treatment includes statins. The current treatment provides significant improvement of lipids.  PVD - she was seen by [facility].  Recommended exercise and smoking cessation.  She is cutting back on cigarettes - down to 5 per day.  Walking every day and doing well.  HM -  She has a mammogram scheduled.  No longer needs routine Pap smears.   Review of Systems  Constitutional: Negative for chills, fatigue and fever.  HENT: Negative for congestion, hearing loss, tinnitus, trouble swallowing and voice change.   Eyes: Negative for visual disturbance.  Respiratory: Negative for cough, choking, chest tightness, shortness of breath and wheezing.   Cardiovascular: Negative for chest pain, palpitations and leg swelling.  Gastrointestinal: Positive for heartburn. Negative for abdominal pain, constipation, diarrhea, dysphagia and vomiting.  Endocrine: Negative for polydipsia and polyuria.  Genitourinary: Negative for dysuria, frequency, genital sores, vaginal bleeding and vaginal discharge.  Musculoskeletal: Negative for arthralgias, gait problem and joint swelling.  Skin: Negative for color  change and rash.  Neurological: Negative for dizziness, tremors, light-headedness and headaches.  Hematological: Negative for adenopathy. Does not bruise/bleed easily.  Psychiatric/Behavioral: Negative for dysphoric mood and sleep disturbance. The patient is not nervous/anxious.     Patient Active Problem List   Diagnosis Date Noted  . Esophageal reflux 09/14/2015  . Peripheral vascular disease ([place]) 02/23/2015  . Tobacco use disorder 02/23/2015  . Familial multiple lipoprotein-type hyperlipidemia 10/22/2014  . Environmental and seasonal allergies 10/22/2014  . Charcot's syndrome ([place]) 10/22/2014  . Hypertension 10/22/2014    Prior to Admission medications   Medication Sig Start Date End Date Taking? Authorizing Provider  aspirin 81 MG tablet Take 81 mg by mouth daily.    Historical Provider, MD  atorvastatin (LIPITOR) 20 MG tablet Take 1 tablet (20 mg total) by mouth daily. 09/14/15   Glean Hess, MD  calcium carbonate (OSCAL) 1500 (600 Ca) MG TABS tablet Take 1 tablet by mouth daily with breakfast.    Historical Provider, MD  lisinopril (PRINIVIL,ZESTRIL) 10 MG tablet Take 1 tablet (10 mg total) by mouth daily. 02/23/15   Glean Hess, MD  omeprazole (PRILOSEC) 40 MG capsule Take 1 capsule (40 mg total) by mouth daily. 09/14/15   Glean Hess, MD  vitamin B-12 (CYANOCOBALAMIN) 1000 MCG tablet Take 1,000 mcg by mouth daily.    Historical Provider, MD    Allergies  Allergen Reactions  . Tetracycline Itching    Past Surgical History:  Procedure Laterality Date  . TUBAL LIGATION Bilateral     Social History  Substance Use Topics  . Smoking status: Current Every Day Smoker  . Smokeless tobacco: Never Used  . Alcohol use 0.0 oz/week   Fall Risk  09/14/2015 02/23/2015  Falls in the past year? No No   Depression screen [facility] 2/9 09/14/2015 02/23/2015  Decreased Interest 0 0  Down, Depressed, Hopeless 0 0  PHQ - 2 Score 0 0     Medication list has been reviewed and  updated.   Physical Exam  Constitutional: She is oriented to person, place, and time. She appears well-developed and well-nourished. No distress.  HENT:  Head: Normocephalic and atraumatic.  Right Ear: Tympanic membrane and ear canal normal.  Left Ear: Tympanic membrane and ear canal normal.  Nose: Right sinus exhibits no maxillary sinus tenderness. Left sinus exhibits no maxillary sinus tenderness.  Mouth/Throat: Uvula is midline and oropharynx is clear and moist.  Eyes: Conjunctivae and EOM are normal. Right eye exhibits no discharge. Left eye exhibits no discharge. No scleral icterus.  Neck: Normal range of motion. Carotid bruit is not present. No erythema present. No thyromegaly present.  Cardiovascular: Normal rate, regular rhythm and normal heart sounds.   Pulses:      Dorsalis pedis pulses are 2+ on the right side, and 1+ on the left side.       Posterior tibial pulses are 1+ on the right side, and 1+ on the left side.  Pulmonary/Chest: Effort normal. No respiratory distress. She has no wheezes. Right breast exhibits no mass, no nipple discharge, no skin change and no tenderness. Left breast exhibits no mass, no nipple discharge, no skin change and no tenderness.  Abdominal: Soft. Bowel sounds are normal. There is no hepatosplenomegaly. There is no tenderness. There is no CVA tenderness.  Musculoskeletal: Normal range of motion.  Lymphadenopathy:    She has no cervical adenopathy.    She has no axillary adenopathy.  Neurological: She is alert and oriented to person, place, and time. She has normal reflexes. No cranial nerve deficit or sensory deficit.  Skin: Skin is warm, dry and intact. No rash noted.  Psychiatric: She has a normal mood and affect. Her speech is normal and behavior is normal. Thought content normal.  Nursing note and vitals reviewed.   BP (!) 178/80   Pulse 60   Resp 16   Ht 5\' 4"  (1.626 m)   Wt 127 lb (57.6 kg)   BMI 21.80 kg/m   Assessment and Plan: 1.  Essential hypertension Needs additional control - CBC with Differential/Platelet - Comprehensive metabolic panel - TSH - POCT urinalysis dipstick - amLODipine (NORVASC) 5 MG tablet; Take 1 tablet (5 mg total) by mouth daily.  Dispense: 30 tablet; Refill: 5  2. Peripheral vascular disease (HCC) Continue aspirin and regular exercise  3. Gastroesophageal reflux disease, esophagitis presence not specified Controlled with PPI  4. Familial multiple lipoprotein-type hyperlipidemia On statin therapy - Lipid panel  5. Tobacco use disorder Not ready to quit completely but cutting back  6. Need for hepatitis C screening test - Hepatitis C antibody  7. Need for pneumococcal vaccination - Pneumococcal conjugate vaccine 13-valent IM   Halina Maidens, MD [facility]  02/28/2016

## 2016-02-29 LAB — CBC WITH DIFFERENTIAL/PLATELET
BASOS ABS: 0 10*3/uL (ref 0.0–0.2)
Basos: 1 %
EOS (ABSOLUTE): 0.3 10*3/uL (ref 0.0–0.4)
EOS: 6 %
HEMATOCRIT: 39 % (ref 34.0–46.6)
HEMOGLOBIN: 13 g/dL (ref 11.1–15.9)
IMMATURE GRANULOCYTES: 0 %
Immature Grans (Abs): 0 10*3/uL (ref 0.0–0.1)
LYMPHS: 38 %
Lymphocytes Absolute: 2 10*3/uL (ref 0.7–3.1)
MCH: 29.3 pg (ref 26.6–33.0)
MCHC: 33.3 g/dL (ref 31.5–35.7)
MCV: 88 fL (ref 79–97)
MONOCYTES: 11 %
Monocytes Absolute: 0.6 10*3/uL (ref 0.1–0.9)
NEUTROS PCT: 44 %
Neutrophils Absolute: 2.3 10*3/uL (ref 1.4–7.0)
Platelets: 394 10*3/uL — ABNORMAL HIGH (ref 150–379)
RBC: 4.44 x10E6/uL (ref 3.77–5.28)
RDW: 15.6 % — ABNORMAL HIGH (ref 12.3–15.4)
WBC: 5.3 10*3/uL (ref 3.4–10.8)

## 2016-02-29 LAB — COMPREHENSIVE METABOLIC PANEL
ALBUMIN: 4.4 g/dL (ref 3.6–4.8)
ALK PHOS: 63 IU/L (ref 39–117)
ALT: 17 IU/L (ref 0–32)
AST: 26 IU/L (ref 0–40)
Albumin/Globulin Ratio: 1.7 (ref 1.2–2.2)
BUN / CREAT RATIO: 11 — AB (ref 12–28)
BUN: 8 mg/dL (ref 8–27)
Bilirubin Total: 0.4 mg/dL (ref 0.0–1.2)
CALCIUM: 9.3 mg/dL (ref 8.7–10.3)
CO2: 28 mmol/L (ref 18–29)
CREATININE: 0.71 mg/dL (ref 0.57–1.00)
Chloride: 99 mmol/L (ref 96–106)
GFR calc Af Amer: 103 mL/min/{1.73_m2} (ref >59)
GFR, EST NON AFRICAN AMERICAN: 89 mL/min/{1.73_m2} (ref >59)
GLOBULIN, TOTAL: 2.6 g/dL (ref 1.5–4.5)
GLUCOSE: 80 mg/dL (ref 65–99)
Potassium: 4.7 mmol/L (ref 3.5–5.2)
SODIUM: 142 mmol/L (ref 134–144)
Total Protein: 7 g/dL (ref 6.0–8.5)

## 2016-02-29 LAB — LIPID PANEL
CHOL/HDL RATIO: 3 ratio (ref 0.0–4.4)
CHOLESTEROL TOTAL: 196 mg/dL (ref 100–199)
HDL: 66 mg/dL (ref >39)
LDL CALC: 115 mg/dL — AB (ref 0–99)
TRIGLYCERIDES: 74 mg/dL (ref 0–149)
VLDL CHOLESTEROL CAL: 15 mg/dL (ref 5–40)

## 2016-02-29 LAB — HEPATITIS C ANTIBODY: Hep C Virus Ab: <0.1 s/co ratio (ref 0.0–0.9)

## 2016-02-29 LAB — TSH: TSH: 1.29 u[IU]/mL (ref 0.450–4.500)

## 2016-03-12 ENCOUNTER — Ambulatory Visit
Admission: RE | Admit: 2016-03-12 | Discharge: 2016-03-12 | Disposition: A | Discharge status: Home / Self Care | Payer: Medicare HMO | Source: Ambulatory Visit | Attending: Internal Medicine | Admitting: Internal Medicine

## 2016-03-12 DIAGNOSIS — Z1231 Encounter for screening mammogram for malignant neoplasm of breast: Secondary | ICD-10-CM | POA: Diagnosis not present

## 2016-04-19 ENCOUNTER — Other Ambulatory Visit: Payer: Self-pay | Admitting: Internal Medicine

## 2016-04-19 DIAGNOSIS — I10 Essential (primary) hypertension: Secondary | ICD-10-CM

## 2016-05-28 ENCOUNTER — Other Ambulatory Visit: Payer: Self-pay | Admitting: Internal Medicine

## 2016-05-28 DIAGNOSIS — E7849 Other hyperlipidemia: Secondary | ICD-10-CM

## 2016-08-13 DIAGNOSIS — H5203 Hypermetropia, bilateral: Secondary | ICD-10-CM | POA: Diagnosis not present

## 2016-08-23 ENCOUNTER — Other Ambulatory Visit: Payer: Self-pay | Admitting: Internal Medicine

## 2016-08-23 DIAGNOSIS — I10 Essential (primary) hypertension: Secondary | ICD-10-CM

## 2016-08-24 ENCOUNTER — Other Ambulatory Visit: Payer: Self-pay | Admitting: Internal Medicine

## 2016-08-24 DIAGNOSIS — E7849 Other hyperlipidemia: Secondary | ICD-10-CM

## 2016-08-27 ENCOUNTER — Ambulatory Visit (INDEPENDENT_AMBULATORY_CARE_PROVIDER_SITE_OTHER): Payer: Medicare HMO | Admitting: Internal Medicine

## 2016-08-27 ENCOUNTER — Encounter: Payer: Self-pay | Admitting: Internal Medicine

## 2016-08-27 VITALS — BP 138/72 | HR 68 | Ht 64.0 in | Wt 128.6 lb

## 2016-08-27 DIAGNOSIS — I739 Peripheral vascular disease, unspecified: Secondary | ICD-10-CM | POA: Diagnosis not present

## 2016-08-27 DIAGNOSIS — I10 Essential (primary) hypertension: Secondary | ICD-10-CM | POA: Diagnosis not present

## 2016-08-27 DIAGNOSIS — R69 Illness, unspecified: Secondary | ICD-10-CM | POA: Diagnosis not present

## 2016-08-27 DIAGNOSIS — F17200 Nicotine dependence, unspecified, uncomplicated: Secondary | ICD-10-CM

## 2016-08-27 NOTE — Progress Notes (Signed)
Date:  08/27/2016   Name:  Angela Herrera   DOB:  07/08/1950   MRN:  [id number]   Chief Complaint: Hypertension Hypertension  This is a chronic problem. The problem is unchanged. The problem is controlled. Pertinent negatives include no chest pain, headaches, palpitations or shortness of breath.  Hyperlipidemia  Associated symptoms include myalgias. Pertinent negatives include no chest pain or shortness of breath.  PVD - stable sx.  Has cramps is legs with prolonged walking but able to resolve with brief rest. She is walking multiple short walks with her dog every day.    Review of Systems  Constitutional: Negative for chills, fatigue and unexpected weight change.  Respiratory: Negative for chest tightness, shortness of breath and wheezing.   Cardiovascular: Negative for chest pain, palpitations and leg swelling.  Gastrointestinal: Negative for abdominal pain.  Musculoskeletal: Positive for myalgias.  Neurological: Negative for dizziness and headaches.  Psychiatric/Behavioral: Negative for sleep disturbance.    Patient Active Problem List   Diagnosis Date Noted  . Esophageal reflux 09/14/2015  . Peripheral vascular disease ([place]) 02/23/2015  . Tobacco use disorder 02/23/2015  . Familial multiple lipoprotein-type hyperlipidemia 10/22/2014  . Environmental and seasonal allergies 10/22/2014  . Charcot's syndrome ([place]) 10/22/2014  . Hypertension 10/22/2014    Prior to Admission medications   Medication Sig Start Date End Date Taking? Authorizing Provider  amLODipine (NORVASC) 5 MG tablet TAKE 1 TABLET(5 MG) BY MOUTH DAILY 08/23/16  Yes Glean Hess, MD  amoxicillin (AMOXIL) 500 MG capsule Take 500 mg by mouth 3 (three) times daily. 08/16/16  Yes [provider]  aspirin 81 MG tablet Take 81 mg by mouth daily.   Yes [provider]  atorvastatin (LIPITOR) 20 MG tablet TAKE 1 TABLET(20 MG) BY MOUTH DAILY 08/24/16  Yes Glean Hess, MD  calcium carbonate  (OSCAL) 1500 (600 Ca) MG TABS tablet Take 1 tablet by mouth daily with breakfast.   Yes [provider]  lisinopril (PRINIVIL,ZESTRIL) 10 MG tablet TAKE 1 TABLET(10 MG) BY MOUTH DAILY 04/19/16  Yes Glean Hess, MD  omeprazole (PRILOSEC) 40 MG capsule Take 1 capsule (40 mg total) by mouth daily. 09/14/15  Yes Glean Hess, MD  vitamin B-12 (CYANOCOBALAMIN) 1000 MCG tablet Take 1,000 mcg by mouth daily.   Yes [provider]    Allergies  Allergen Reactions  . Tetracycline Itching    Past Surgical History:  Procedure Laterality Date  . TUBAL LIGATION Bilateral     Social History  Substance Use Topics  . Smoking status: Current Every Day Smoker  . Smokeless tobacco: Never Used  . Alcohol use 0.0 oz/week     Medication list has been reviewed and updated.   Physical Exam  Constitutional: She is oriented to person, place, and time. She appears well-developed. No distress.  HENT:  Head: Normocephalic and atraumatic.  Neck: Normal range of motion. Neck supple. Carotid bruit is not present.  Cardiovascular: Normal rate, regular rhythm and normal heart sounds.   Pulses:      Posterior tibial pulses are 1+ on the right side, and 1+ on the left side.  Pulmonary/Chest: Effort normal and breath sounds normal. No respiratory distress. She has no wheezes.  Musculoskeletal: She exhibits no edema or tenderness.  Neurological: She is alert and oriented to person, place, and time.  Skin: Skin is warm and dry. No rash noted.  Psychiatric: She has a normal mood and affect. Her behavior is normal. Thought content normal.  Nursing note and vitals reviewed.   BP 138/72 (BP Location: Right Arm, Patient Position: Sitting, Cuff Size: Normal)   Pulse 68   Ht 5\' 4"  (1.626 m)   Wt 128 lb 9.6 oz (58.3 kg)   SpO2 100%   BMI 22.07 kg/m   Assessment and Plan: 1. Essential hypertension controlled  2. Peripheral vascular disease (HCC) Stable, continue exercise  daily  3. Tobacco use disorder Encouraged to continue cutting back Pt is concerned about Chantix side effects   No orders of the defined types were placed in this encounter.   Halina Maidens, MD [facility]  08/27/2016

## 2016-10-15 ENCOUNTER — Other Ambulatory Visit: Payer: Self-pay | Admitting: Internal Medicine

## 2016-10-15 DIAGNOSIS — I10 Essential (primary) hypertension: Secondary | ICD-10-CM

## 2016-10-15 DIAGNOSIS — K219 Gastro-esophageal reflux disease without esophagitis: Secondary | ICD-10-CM

## 2016-12-17 ENCOUNTER — Other Ambulatory Visit: Payer: Self-pay | Admitting: Internal Medicine

## 2016-12-17 DIAGNOSIS — I10 Essential (primary) hypertension: Secondary | ICD-10-CM

## 2017-02-06 DIAGNOSIS — H25013 Cortical age-related cataract, bilateral: Secondary | ICD-10-CM | POA: Diagnosis not present

## 2017-02-25 ENCOUNTER — Other Ambulatory Visit: Payer: Self-pay | Admitting: Internal Medicine

## 2017-02-25 DIAGNOSIS — Z1231 Encounter for screening mammogram for malignant neoplasm of breast: Secondary | ICD-10-CM

## 2017-02-27 ENCOUNTER — Encounter: Payer: Self-pay | Admitting: Internal Medicine

## 2017-02-27 ENCOUNTER — Ambulatory Visit: Payer: Medicare HMO | Admitting: Internal Medicine

## 2017-02-27 VITALS — BP 118/76 | HR 67 | Ht 64.0 in | Wt 129.0 lb

## 2017-02-27 DIAGNOSIS — F17200 Nicotine dependence, unspecified, uncomplicated: Secondary | ICD-10-CM

## 2017-02-27 DIAGNOSIS — E7849 Other hyperlipidemia: Secondary | ICD-10-CM

## 2017-02-27 DIAGNOSIS — E2839 Other primary ovarian failure: Secondary | ICD-10-CM | POA: Diagnosis not present

## 2017-02-27 DIAGNOSIS — I10 Essential (primary) hypertension: Secondary | ICD-10-CM | POA: Diagnosis not present

## 2017-02-27 DIAGNOSIS — Z1211 Encounter for screening for malignant neoplasm of colon: Secondary | ICD-10-CM | POA: Diagnosis not present

## 2017-02-27 DIAGNOSIS — R69 Illness, unspecified: Secondary | ICD-10-CM | POA: Diagnosis not present

## 2017-02-27 DIAGNOSIS — K219 Gastro-esophageal reflux disease without esophagitis: Secondary | ICD-10-CM

## 2017-02-27 MED ORDER — VARENICLINE TARTRATE 0.5 MG X 11 & 1 MG X 42 PO MISC: ORAL | 0 refills | Status: DC

## 2017-02-27 NOTE — Progress Notes (Signed)
Date:  02/27/2017   Name:  Angela Herrera   DOB:  [birth year]   MRN:  [id number]   Chief Complaint: Hypertension; Gastroesophageal Reflux; and Nicotine Dependence (interested in Chantix. ) Hypertension  This is a chronic problem. The problem is controlled. Pertinent negatives include no chest pain, headaches, palpitations or shortness of breath.  Gastroesophageal Reflux  She reports no abdominal pain, no chest pain, no coughing or no wheezing. This is a recurrent problem. The problem occurs occasionally. Pertinent negatives include no fatigue.  Hyperlipidemia  This is a chronic problem. Pertinent negatives include no chest pain or shortness of breath. Current antihyperlipidemic treatment includes statins. The current treatment provides significant improvement of lipids. There are no compliance problems.   Smoking - still smoking 1/2 pack per day.  She is interested in quitting and would like to try chantix. HM - she has mammogram scheduled with no breast complaints.  She is due for colonoscopy.  She will check on coverage of DEXA.  Vaccinations at UTD.   Review of Systems  Constitutional: Negative for chills, fatigue and fever.  HENT: Negative for congestion, hearing loss, tinnitus, trouble swallowing and voice change.   Eyes: Negative for visual disturbance.  Respiratory: Negative for cough, chest tightness, shortness of breath and wheezing.   Cardiovascular: Negative for chest pain, palpitations and leg swelling.  Gastrointestinal: Positive for anal bleeding (hemorrhoids) and constipation. Negative for abdominal pain, diarrhea and vomiting.  Endocrine: Negative for polydipsia and polyuria.  Genitourinary: Negative for dysuria, frequency, genital sores, vaginal bleeding and vaginal discharge.       Night sweats and nocturia  Musculoskeletal: Negative for arthralgias, gait problem and joint swelling.  Skin: Negative for color change and rash.  Neurological: Negative for dizziness,  tremors, light-headedness and headaches.  Hematological: Negative for adenopathy. Does not bruise/bleed easily.  Psychiatric/Behavioral: Negative for dysphoric mood and sleep disturbance. The patient is not nervous/anxious.     Patient Active Problem List   Diagnosis Date Noted  . Esophageal reflux 09/14/2015  . Peripheral vascular disease ([place]) 02/23/2015  . Tobacco use disorder 02/23/2015  . Familial multiple lipoprotein-type hyperlipidemia 10/22/2014  . Environmental and seasonal allergies 10/22/2014  . Charcot's syndrome ([place]) 10/22/2014  . Hypertension 10/22/2014    Prior to Admission medications   Medication Sig Start Date End Date Taking? Authorizing Provider  amLODipine (NORVASC) 5 MG tablet TAKE 1 TABLET(5 MG) BY MOUTH DAILY 12/17/16  Yes Glean Hess, MD  aspirin 81 MG tablet Take 81 mg by mouth daily.   Yes [provider]  atorvastatin (LIPITOR) 20 MG tablet TAKE 1 TABLET(20 MG) BY MOUTH DAILY 08/24/16  Yes Glean Hess, MD  calcium carbonate (OSCAL) 1500 (600 Ca) MG TABS tablet Take 1 tablet by mouth daily with breakfast.   Yes [provider]  lisinopril (PRINIVIL,ZESTRIL) 10 MG tablet TAKE 1 TABLET(10 MG) BY MOUTH DAILY 10/15/16  Yes Glean Hess, MD  omeprazole (PRILOSEC) 40 MG capsule TAKE 1 CAPSULE(40 MG) BY MOUTH DAILY 10/15/16  Yes Glean Hess, MD  vitamin B-12 (CYANOCOBALAMIN) 1000 MCG tablet Take 1,000 mcg by mouth daily.   Yes [provider]  Multiple Vitamins-Minerals (MULTIVITAMIN WITH MINERALS) tablet Take 1 tablet daily by mouth.    [provider]    Allergies  Allergen Reactions  . Tetracycline Itching    Past Surgical History:  Procedure Laterality Date  . TUBAL LIGATION Bilateral     Social History   Tobacco Use  . Smoking  status: Current Every Day Smoker  . Smokeless tobacco: Never Used  Substance Use Topics  . Alcohol use: Yes    Alcohol/week: 0.0 oz  . Drug use: No     Medication  list has been reviewed and updated.  PHQ 2/9 Scores 02/27/2017 09/14/2015 02/23/2015  PHQ - 2 Score 0 0 0    Physical Exam  Constitutional: She is oriented to person, place, and time. She appears well-developed. No distress.  HENT:  Head: Normocephalic and atraumatic.  Neck: Carotid bruit is not present.  Cardiovascular: Normal rate, regular rhythm, S1 normal and normal heart sounds.  Pulmonary/Chest: Effort normal and breath sounds normal. No respiratory distress. She has no wheezes. She has no rhonchi.  Abdominal: Soft. Normal appearance and bowel sounds are normal. She exhibits no distension. There is no tenderness.  Musculoskeletal: Normal range of motion. She exhibits no edema or tenderness.  Neurological: She is alert and oriented to person, place, and time. She has normal reflexes.  Skin: Skin is warm and dry. No rash noted.  Psychiatric: She has a normal mood and affect. Her behavior is normal. Thought content normal.  Nursing note and vitals reviewed.   BP 118/76   Pulse 67   Ht 5\' 4"  (1.626 m)   Wt 129 lb (58.5 kg)   SpO2 100%   BMI 22.14 kg/m   Assessment and Plan: 1. Essential hypertension controlled - Comprehensive metabolic panel - TSH  2. Gastroesophageal reflux disease, esophagitis presence not specified stable - CBC with Differential/Platelet  3. Familial multiple lipoprotein-type hyperlipidemia Doing well on statin therapy - Lipid panel  4. Tobacco use disorder - varenicline (CHANTIX STARTING MONTH PAK) 0.5 MG X 11 & 1 MG X 42 tablet; Take one 0.5 mg tablet by mouth once daily for 3 days, then increase to one 0.5 mg tablet twice daily for 4 days, then increase to one 1 mg tablet twice daily.  Dispense: 53 tablet; Refill: 0  5. Colon cancer screening Refer to Dr. Allen Norris - Ambulatory referral to Gastroenterology  6. Ovarian failure Check on coverage of DEXA May stop calcium since constipating unless DEXA is low or there is a fracture   Meds ordered  this encounter  Medications  . varenicline (CHANTIX STARTING MONTH PAK) 0.5 MG X 11 & 1 MG X 42 tablet    Sig: Take one 0.5 mg tablet by mouth once daily for 3 days, then increase to one 0.5 mg tablet twice daily for 4 days, then increase to one 1 mg tablet twice daily.    Dispense:  53 tablet    Refill:  0    Partially dictated using Editor, commissioning. Any errors are unintentional.  Halina Maidens, MD [facility]  02/27/2017

## 2017-02-27 NOTE — Patient Instructions (Addendum)
You can stop the Calcium - unless we get a bone density and its low.   Steps to Quit Smoking Smoking tobacco can be harmful to your health and can affect almost every organ in your body. Smoking puts you, and those around you, at risk for developing many serious chronic diseases. Quitting smoking is difficult, but it is one of the best things that you can do for your health. It is never too late to quit. What are the benefits of quitting smoking? When you quit smoking, you lower your risk of developing serious diseases and conditions, such as:  Lung cancer or lung disease, such as COPD.  Heart disease.  Stroke.  Heart attack.  Infertility.  Osteoporosis and bone fractures.  Additionally, symptoms such as coughing, wheezing, and shortness of breath may get better when you quit. You may also find that you get sick less often because your body is stronger at fighting off colds and infections. If you are pregnant, quitting smoking can help to reduce your chances of having a baby of low birth weight. How do I get ready to quit? When you decide to quit smoking, create a plan to make sure that you are successful. Before you quit:  Pick a date to quit. Set a date within the next two weeks to give you time to prepare.  Write down the reasons why you are quitting. Keep this list in places where you will see it often, such as on your bathroom mirror or in your car or wallet.  Identify the people, places, things, and activities that make you want to smoke (triggers) and avoid them. Make sure to take these actions: ? Throw away all cigarettes at home, at work, and in your car. ? Throw away smoking accessories, such as Scientist, research (medical). ? Clean your car and make sure to empty the ashtray. ? Clean your home, including curtains and carpets.  Tell your family, friends, and coworkers that you are quitting. Support from your loved ones can make quitting easier.  Talk with your health care  provider about your options for quitting smoking.  Find out what treatment options are covered by your health insurance.  What strategies can I use to quit smoking? Talk with your healthcare provider about different strategies to quit smoking. Some strategies include:  Quitting smoking altogether instead of gradually lessening how much you smoke over a period of time. Research shows that quitting "cold [place]" is more successful than gradually quitting.  Attending in-person counseling to help you build problem-solving skills. You are more likely to have success in quitting if you attend several counseling sessions. Even short sessions of 10 minutes can be effective.  Finding resources and support systems that can help you to quit smoking and remain smoke-free after you quit. These resources are most helpful when you use them often. They can include: ? Online chats with a Social worker. ? Telephone quitlines. ? Careers information officer. ? Support groups or group counseling. ? Text messaging programs. ? Mobile phone applications.  Taking medicines to help you quit smoking. (If you are pregnant or breastfeeding, talk with your health care provider first.) Some medicines contain nicotine and some do not. Both types of medicines help with cravings, but the medicines that include nicotine help to relieve withdrawal symptoms. Your health care provider may recommend: ? Nicotine patches, gum, or lozenges. ? Nicotine inhalers or sprays. ? Non-nicotine medicine that is taken by mouth.  Talk with your health care provider about combining  strategies, such as taking medicines while you are also receiving in-person counseling. Using these two strategies together makes you more likely to succeed in quitting than if you used either strategy on its own. If you are pregnant or breastfeeding, talk with your health care provider about finding counseling or other support strategies to quit smoking. Do not take  medicine to help you quit smoking unless told to do so by your health care provider. What things can I do to make it easier to quit? Quitting smoking might feel overwhelming at first, but there is a lot that you can do to make it easier. Take these important actions:  Reach out to your family and friends and ask that they support and encourage you during this time. Call telephone quitlines, reach out to support groups, or work with a counselor for support.  Ask people who smoke to avoid smoking around you.  Avoid places that trigger you to smoke, such as bars, parties, or smoke-break areas at work.  Spend time around people who do not smoke.  Lessen stress in your life, because stress can be a smoking trigger for some people. To lessen stress, try: ? Exercising regularly. ? Deep-breathing exercises. ? Yoga. ? Meditating. ? Performing a body scan. This involves closing your eyes, scanning your body from head to toe, and noticing which parts of your body are particularly tense. Purposefully relax the muscles in those areas.  Download or purchase mobile phone or tablet apps (applications) that can help you stick to your quit plan by providing reminders, tips, and encouragement. There are many free apps, such as QuitGuide from the [employer] Office manager for Disease Control and Prevention). You can find other support for quitting smoking (smoking cessation) through smokefree.gov and other websites.  How will I feel when I quit smoking? Within the first 24 hours of quitting smoking, you may start to feel some withdrawal symptoms. These symptoms are usually most noticeable 2-3 days after quitting, but they usually do not last beyond 2-3 weeks. Changes or symptoms that you might experience include:  Mood swings.  Restlessness, anxiety, or irritation.  Difficulty concentrating.  Dizziness.  Strong cravings for sugary foods in addition to nicotine.  Mild weight  gain.  Constipation.  Nausea.  Coughing or a sore throat.  Changes in how your medicines work in your body.  A depressed mood.  Difficulty sleeping (insomnia).  After the first 2-3 weeks of quitting, you may start to notice more positive results, such as:  Improved sense of smell and taste.  Decreased coughing and sore throat.  Slower heart rate.  Lower blood pressure.  Clearer skin.  The ability to breathe more easily.  Fewer sick days.  Quitting smoking is very challenging for most people. Do not get discouraged if you are not successful the first time. Some people need to make many attempts to quit before they achieve long-term success. Do your best to stick to your quit plan, and talk with your health care provider if you have any questions or concerns. This information is not intended to replace advice given to you by your health care provider. Make sure you discuss any questions you have with your health care provider. Document Released: 04/03/2001 Document Revised: 12/06/2015 Document Reviewed: 08/24/2014 Elsevier Interactive Patient Education  2017 [employer].

## 2017-02-28 LAB — CBC WITH DIFFERENTIAL/PLATELET
BASOS ABS: 0 10*3/uL (ref 0.0–0.2)
Basos: 0 %
EOS (ABSOLUTE): 0.2 10*3/uL (ref 0.0–0.4)
Eos: 4 %
Hematocrit: 40.1 % (ref 34.0–46.6)
Hemoglobin: 13.4 g/dL (ref 11.1–15.9)
IMMATURE GRANS (ABS): 0 10*3/uL (ref 0.0–0.1)
Immature Granulocytes: 0 %
Lymphocytes Absolute: 2.3 10*3/uL (ref 0.7–3.1)
Lymphs: 46 %
MCH: 28.9 pg (ref 26.6–33.0)
MCHC: 33.4 g/dL (ref 31.5–35.7)
MCV: 87 fL (ref 79–97)
Monocytes Absolute: 0.6 10*3/uL (ref 0.1–0.9)
Monocytes: 11 %
NEUTROS ABS: 2 10*3/uL (ref 1.4–7.0)
NEUTROS PCT: 39 %
PLATELETS: 394 10*3/uL — AB (ref 150–379)
RBC: 4.63 x10E6/uL (ref 3.77–5.28)
RDW: 15.1 % (ref 12.3–15.4)
WBC: 5.1 10*3/uL (ref 3.4–10.8)

## 2017-02-28 LAB — COMPREHENSIVE METABOLIC PANEL
ALK PHOS: 71 IU/L (ref 39–117)
ALT: 12 IU/L (ref 0–32)
AST: 25 IU/L (ref 0–40)
Albumin/Globulin Ratio: 1.5 (ref 1.2–2.2)
Albumin: 4.5 g/dL (ref 3.6–4.8)
BILIRUBIN TOTAL: 0.7 mg/dL (ref 0.0–1.2)
BUN/Creatinine Ratio: 14 (ref 12–28)
BUN: 10 mg/dL (ref 8–27)
CHLORIDE: 99 mmol/L (ref 96–106)
CO2: 24 mmol/L (ref 20–29)
Calcium: 9.8 mg/dL (ref 8.7–10.3)
Creatinine, Ser: 0.73 mg/dL (ref 0.57–1.00)
GFR calc Af Amer: 99 mL/min/{1.73_m2} (ref >59)
GFR calc non Af Amer: 85 mL/min/{1.73_m2} (ref >59)
GLUCOSE: 87 mg/dL (ref 65–99)
Globulin, Total: 3.1 g/dL (ref 1.5–4.5)
Potassium: 4.5 mmol/L (ref 3.5–5.2)
Sodium: 141 mmol/L (ref 134–144)
TOTAL PROTEIN: 7.6 g/dL (ref 6.0–8.5)

## 2017-02-28 LAB — TSH: TSH: 1.53 u[IU]/mL (ref 0.450–4.500)

## 2017-02-28 LAB — LIPID PANEL
CHOLESTEROL TOTAL: 213 mg/dL — AB (ref 100–199)
Chol/HDL Ratio: 3.2 ratio (ref 0.0–4.4)
HDL: 67 mg/dL (ref >39)
LDL Calculated: 123 mg/dL — ABNORMAL HIGH (ref 0–99)
TRIGLYCERIDES: 117 mg/dL (ref 0–149)
VLDL CHOLESTEROL CAL: 23 mg/dL (ref 5–40)

## 2017-03-19 ENCOUNTER — Ambulatory Visit
Admission: RE | Admit: 2017-03-19 | Discharge: 2017-03-19 | Disposition: A | Discharge status: Home / Self Care | Payer: Medicare HMO | Source: Ambulatory Visit | Attending: Internal Medicine | Admitting: Internal Medicine

## 2017-03-19 DIAGNOSIS — Z1231 Encounter for screening mammogram for malignant neoplasm of breast: Secondary | ICD-10-CM | POA: Insufficient documentation

## 2017-04-29 ENCOUNTER — Other Ambulatory Visit: Payer: Self-pay

## 2017-04-29 DIAGNOSIS — Z1211 Encounter for screening for malignant neoplasm of colon: Secondary | ICD-10-CM

## 2017-04-29 DIAGNOSIS — Z1212 Encounter for screening for malignant neoplasm of rectum: Principal | ICD-10-CM

## 2017-04-29 NOTE — Progress Notes (Signed)
Gastroenterology Pre-Procedure Review  Request Date: 05/24/17 Requesting Physician: Dr. Allen Norris  PATIENT REVIEW QUESTIONS: The patient responded to the following health history questions as indicated:    1. Are you having any GI issues? no 2. Do you have a personal history of Polyps? yes (2008) 3. Do you have a family history of Colon Cancer or Polyps? no 4. Diabetes Mellitus? no 5. Joint replacements in the past 12 months?no 6. Major health problems in the past 3 months?no 7. Any artificial heart valves, MVP, or defibrillator?no    MEDICATIONS & ALLERGIES:    Patient reports the following regarding taking any anticoagulation/antiplatelet therapy:   Plavix, Coumadin, Eliquis, Xarelto, Lovenox, Pradaxa, Brilinta, or Effient? no Aspirin? yes (81mg )  Patient confirms/reports the following medications:  Current Outpatient Medications  Medication Sig Dispense Refill  . amLODipine (NORVASC) 5 MG tablet TAKE 1 TABLET(5 MG) BY MOUTH DAILY 90 tablet 1  . aspirin 81 MG tablet Take 81 mg by mouth daily.    Marland Kitchen atorvastatin (LIPITOR) 20 MG tablet TAKE 1 TABLET(20 MG) BY MOUTH DAILY 90 tablet 3  . lisinopril (PRINIVIL,ZESTRIL) 10 MG tablet TAKE 1 TABLET(10 MG) BY MOUTH DAILY 90 tablet 3  . Multiple Vitamins-Minerals (MULTIVITAMIN WITH MINERALS) tablet Take 1 tablet daily by mouth.    Marland Kitchen omeprazole (PRILOSEC) 40 MG capsule TAKE 1 CAPSULE(40 MG) BY MOUTH DAILY 90 capsule 3  . varenicline (CHANTIX STARTING MONTH PAK) 0.5 MG X 11 & 1 MG X 42 tablet Take one 0.5 mg tablet by mouth once daily for 3 days, then increase to one 0.5 mg tablet twice daily for 4 days, then increase to one 1 mg tablet twice daily. 53 tablet 0  . vitamin B-12 (CYANOCOBALAMIN) 1000 MCG tablet Take 1,000 mcg by mouth daily.     No current facility-administered medications for this visit.     Patient confirms/reports the following allergies:  Allergies  Allergen Reactions  . Tetracycline Itching    No orders of the defined types  were placed in this encounter.   AUTHORIZATION INFORMATION Primary Insurance: 1D#: Group #:  Secondary Insurance: 1D#: Group #:  SCHEDULE INFORMATION: Date: 05/24/17 Time: Location: [place]

## 2017-05-20 ENCOUNTER — Telehealth: Payer: Self-pay | Admitting: Gastroenterology

## 2017-05-20 ENCOUNTER — Encounter: Payer: Self-pay | Admitting: *Deleted

## 2017-05-20 ENCOUNTER — Other Ambulatory Visit: Payer: Self-pay

## 2017-05-20 NOTE — Telephone Encounter (Signed)
PATIENT CALLED & L/M ON ANSWERING MACHINE & HAS NOT RECEIVED HER MEDICATION FOR HER COLONOSCOPY SCHEDULE WITH DR [facility] ON 05-24-17

## 2017-05-21 ENCOUNTER — Encounter: Payer: Self-pay | Admitting: Anesthesiology

## 2017-05-21 NOTE — Telephone Encounter (Signed)
Contacted patient to let her know rx for bowel prep will be called in to her pharmacy.  Pharmacist will call when rx is ready.

## 2017-05-21 NOTE — Discharge Instructions (Signed)
General Anesthesia, Adult, Care After °These instructions provide you with information about caring for yourself after your procedure. Your health care provider may also give you more specific instructions. Your treatment has been planned according to current medical practices, but problems sometimes occur. Call your health care provider if you have any problems or questions after your procedure. °What can I expect after the procedure? °After the procedure, it is common to have: °· Vomiting. °· A sore throat. °· Mental slowness. ° °It is common to feel: °· Nauseous. °· Cold or shivery. °· Sleepy. °· Tired. °· Sore or achy, even in parts of your body where you did not have surgery. ° °Follow these instructions at home: °For at least 24 hours after the procedure: °· Do not: °? Participate in activities where you could fall or become injured. °? Drive. °? Use heavy machinery. °? Drink alcohol. °? Take sleeping pills or medicines that cause drowsiness. °? Make important decisions or sign legal documents. °? Take care of children on your own. °· Rest. °Eating and drinking °· If you vomit, drink water, juice, or soup when you can drink without vomiting. °· Drink enough fluid to keep your urine clear or pale yellow. °· Make sure you have little or no nausea before eating solid foods. °· Follow the diet recommended by your health care provider. °General instructions °· Have a responsible adult stay with you until you are awake and alert. °· Return to your normal activities as told by your health care provider. Ask your health care provider what activities are safe for you. °· Take over-the-counter and prescription medicines only as told by your health care provider. °· If you smoke, do not smoke without supervision. °· Keep all follow-up visits as told by your health care provider. This is important. °Contact a health care provider if: °· You continue to have nausea or vomiting at home, and medicines are not helpful. °· You  cannot drink fluids or start eating again. °· You cannot urinate after 8-12 hours. °· You develop a skin rash. °· You have fever. °· You have increasing redness at the site of your procedure. °Get help right away if: °· You have difficulty breathing. °· You have chest pain. °· You have unexpected bleeding. °· You feel that you are having a life-threatening or urgent problem. °This information is not intended to replace advice given to you by your health care provider. Make sure you discuss any questions you have with your health care provider. °Document Released: 07/16/2000 Document Revised: 09/12/2015 Document Reviewed: 03/24/2015 °Elsevier Interactive Patient Education © 2018 Elsevier Inc. ° °

## 2017-05-22 ENCOUNTER — Telehealth: Payer: Self-pay | Admitting: Gastroenterology

## 2017-05-22 NOTE — Telephone Encounter (Signed)
Colonoscopy has been canceled.

## 2017-05-22 NOTE — Telephone Encounter (Signed)
Patient Angela Herrera and has to cancel her procedure for this Friday due to an emergency out of town.

## 2017-05-24 ENCOUNTER — Ambulatory Visit: Admission: RE | Admit: 2017-05-24 | Payer: Medicare HMO | Source: Ambulatory Visit | Admitting: Gastroenterology

## 2017-05-24 HISTORY — DX: Essential (primary) hypertension: I10

## 2017-05-24 HISTORY — DX: Peripheral vascular disease, unspecified: I73.9

## 2017-05-24 HISTORY — DX: Gastro-esophageal reflux disease without esophagitis: K21.9

## 2017-05-24 HISTORY — DX: Hyperlipidemia, unspecified: E78.5

## 2017-05-24 SURGERY — COLONOSCOPY WITH PROPOFOL
Anesthesia: Choice

## 2017-05-27 ENCOUNTER — Telehealth: Payer: Self-pay | Admitting: Gastroenterology

## 2017-05-27 NOTE — Telephone Encounter (Signed)
Patient called to reschedule her colonoscopy with Dr Allen Norris

## 2017-05-28 ENCOUNTER — Telehealth: Payer: Self-pay

## 2017-05-28 ENCOUNTER — Telehealth: Payer: Self-pay | Admitting: Internal Medicine

## 2017-05-28 NOTE — Telephone Encounter (Signed)
Patient called stating she tried to reschedule colonoscopy with Dr Dorothey Baseman office but never received call back. She also stated she never received instructions for procedure.   Called our office to see if we can contact them for her. She stated medication they sent in was $176 since insurance will not cover it for her. Wanted alternative medication as well.   Sent Dr Dorothey Baseman nurse a message regarding contacting the patient.

## 2017-05-29 ENCOUNTER — Other Ambulatory Visit: Payer: Self-pay

## 2017-05-29 DIAGNOSIS — Z1211 Encounter for screening for malignant neoplasm of colon: Secondary | ICD-10-CM

## 2017-05-29 MED ORDER — PEG 3350-KCL-NABCB-NACL-NASULF 236 G PO SOLR: 4000.0000 mL | Freq: Once | ORAL | 0 refills | Status: AC

## 2017-05-29 NOTE — Telephone Encounter (Signed)
Patients colonoscopy has been scheduled for 02/20 with Dr. Marius Ditch at [facility].  Rx for Golytely has been faxed to pharmacy.  Instructed patient to begin at 5pm the day before her colonoscopy drinking 8 oz every 20 minutes until bowels begin to run clear.  Thanks [employer]

## 2017-06-06 ENCOUNTER — Encounter: Payer: Self-pay | Admitting: *Deleted

## 2017-06-06 ENCOUNTER — Other Ambulatory Visit: Payer: Self-pay

## 2017-06-10 NOTE — Discharge Instructions (Signed)
General Anesthesia, Adult, Care After °These instructions provide you with information about caring for yourself after your procedure. Your health care provider may also give you more specific instructions. Your treatment has been planned according to current medical practices, but problems sometimes occur. Call your health care provider if you have any problems or questions after your procedure. °What can I expect after the procedure? °After the procedure, it is common to have: °· Vomiting. °· A sore throat. °· Mental slowness. ° °It is common to feel: °· Nauseous. °· Cold or shivery. °· Sleepy. °· Tired. °· Sore or achy, even in parts of your body where you did not have surgery. ° °Follow these instructions at home: °For at least 24 hours after the procedure: °· Do not: °? Participate in activities where you could fall or become injured. °? Drive. °? Use heavy machinery. °? Drink alcohol. °? Take sleeping pills or medicines that cause drowsiness. °? Make important decisions or sign legal documents. °? Take care of children on your own. °· Rest. °Eating and drinking °· If you vomit, drink water, juice, or soup when you can drink without vomiting. °· Drink enough fluid to keep your urine clear or pale yellow. °· Make sure you have little or no nausea before eating solid foods. °· Follow the diet recommended by your health care provider. °General instructions °· Have a responsible adult stay with you until you are awake and alert. °· Return to your normal activities as told by your health care provider. Ask your health care provider what activities are safe for you. °· Take over-the-counter and prescription medicines only as told by your health care provider. °· If you smoke, do not smoke without supervision. °· Keep all follow-up visits as told by your health care provider. This is important. °Contact a health care provider if: °· You continue to have nausea or vomiting at home, and medicines are not helpful. °· You  cannot drink fluids or start eating again. °· You cannot urinate after 8-12 hours. °· You develop a skin rash. °· You have fever. °· You have increasing redness at the site of your procedure. °Get help right away if: °· You have difficulty breathing. °· You have chest pain. °· You have unexpected bleeding. °· You feel that you are having a life-threatening or urgent problem. °This information is not intended to replace advice given to you by your health care provider. Make sure you discuss any questions you have with your health care provider. °Document Released: 07/16/2000 Document Revised: 09/12/2015 Document Reviewed: 03/24/2015 °Elsevier Interactive Patient Education © 2018 Elsevier Inc. ° °

## 2017-06-12 ENCOUNTER — Ambulatory Visit: Payer: Medicare HMO | Admitting: Anesthesiology

## 2017-06-12 ENCOUNTER — Ambulatory Visit
Admission: RE | Admit: 2017-06-12 | Discharge: 2017-06-12 | Disposition: A | Discharge status: Home / Self Care | Payer: Medicare HMO | Source: Ambulatory Visit | Attending: Gastroenterology | Admitting: Gastroenterology

## 2017-06-12 ENCOUNTER — Encounter
Admission: RE | Disposition: A | Discharge status: Home / Self Care | Payer: Self-pay | Source: Ambulatory Visit | Attending: Gastroenterology

## 2017-06-12 DIAGNOSIS — Z8249 Family history of ischemic heart disease and other diseases of the circulatory system: Secondary | ICD-10-CM | POA: Insufficient documentation

## 2017-06-12 DIAGNOSIS — K219 Gastro-esophageal reflux disease without esophagitis: Secondary | ICD-10-CM | POA: Diagnosis not present

## 2017-06-12 DIAGNOSIS — Z1211 Encounter for screening for malignant neoplasm of colon: Secondary | ICD-10-CM

## 2017-06-12 DIAGNOSIS — F17210 Nicotine dependence, cigarettes, uncomplicated: Secondary | ICD-10-CM | POA: Diagnosis not present

## 2017-06-12 DIAGNOSIS — K648 Other hemorrhoids: Secondary | ICD-10-CM | POA: Diagnosis not present

## 2017-06-12 DIAGNOSIS — Z823 Family history of stroke: Secondary | ICD-10-CM | POA: Insufficient documentation

## 2017-06-12 DIAGNOSIS — Z79899 Other long term (current) drug therapy: Secondary | ICD-10-CM | POA: Diagnosis not present

## 2017-06-12 DIAGNOSIS — Z881 Allergy status to other antibiotic agents status: Secondary | ICD-10-CM | POA: Insufficient documentation

## 2017-06-12 DIAGNOSIS — K5730 Diverticulosis of large intestine without perforation or abscess without bleeding: Secondary | ICD-10-CM | POA: Insufficient documentation

## 2017-06-12 DIAGNOSIS — I739 Peripheral vascular disease, unspecified: Secondary | ICD-10-CM | POA: Insufficient documentation

## 2017-06-12 DIAGNOSIS — I10 Essential (primary) hypertension: Secondary | ICD-10-CM | POA: Diagnosis not present

## 2017-06-12 DIAGNOSIS — D122 Benign neoplasm of ascending colon: Secondary | ICD-10-CM | POA: Diagnosis not present

## 2017-06-12 DIAGNOSIS — Z7982 Long term (current) use of aspirin: Secondary | ICD-10-CM | POA: Diagnosis not present

## 2017-06-12 DIAGNOSIS — E785 Hyperlipidemia, unspecified: Secondary | ICD-10-CM | POA: Diagnosis not present

## 2017-06-12 DIAGNOSIS — K635 Polyp of colon: Secondary | ICD-10-CM | POA: Diagnosis not present

## 2017-06-12 DIAGNOSIS — F17200 Nicotine dependence, unspecified, uncomplicated: Secondary | ICD-10-CM | POA: Insufficient documentation

## 2017-06-12 DIAGNOSIS — R69 Illness, unspecified: Secondary | ICD-10-CM | POA: Diagnosis not present

## 2017-06-12 HISTORY — PX: COLONOSCOPY WITH PROPOFOL: SHX5780

## 2017-06-12 SURGERY — COLONOSCOPY WITH PROPOFOL
Anesthesia: General | Site: Rectum | Wound class: Contaminated

## 2017-06-12 MED ORDER — STERILE WATER FOR IRRIGATION IR SOLN
Status: DC | PRN
  Administered 2017-06-12: .5 mL

## 2017-06-12 MED ORDER — LIDOCAINE HCL (CARDIAC) 20 MG/ML IV SOLN
INTRAVENOUS | Status: DC | PRN
  Administered 2017-06-12: 40 mg via INTRAVENOUS

## 2017-06-12 MED ORDER — ONDANSETRON HCL 4 MG/2ML IJ SOLN: 4.0000 mg | Freq: Once | INTRAMUSCULAR | Status: DC | PRN

## 2017-06-12 MED ORDER — ACETAMINOPHEN 160 MG/5ML PO SOLN: 325.0000 mg | ORAL | Status: DC | PRN

## 2017-06-12 MED ORDER — ACETAMINOPHEN 325 MG PO TABS: 650.0000 mg | ORAL_TABLET | Freq: Once | ORAL | Status: DC | PRN

## 2017-06-12 MED ORDER — SODIUM CHLORIDE 0.9 % IV SOLN: INTRAVENOUS | Status: DC

## 2017-06-12 MED ORDER — PROPOFOL 10 MG/ML IV BOLUS
INTRAVENOUS | Status: DC | PRN
  Administered 2017-06-12 (×2): 30 mg via INTRAVENOUS
  Administered 2017-06-12 (×2): 20 mg via INTRAVENOUS
  Administered 2017-06-12: 30 mg via INTRAVENOUS
  Administered 2017-06-12: 100 mg via INTRAVENOUS
  Administered 2017-06-12 (×2): 20 mg via INTRAVENOUS
  Administered 2017-06-12: 10 mg via INTRAVENOUS
  Administered 2017-06-12: 20 mg via INTRAVENOUS

## 2017-06-12 MED ORDER — LACTATED RINGERS IV SOLN: INTRAVENOUS | Status: DC

## 2017-06-12 MED ORDER — LACTATED RINGERS IV SOLN
INTRAVENOUS | Status: DC
  Administered 2017-06-12: 09:00:00 via INTRAVENOUS

## 2017-06-12 SURGICAL SUPPLY — 6 items
CANISTER SUCT 1200ML W/VALVE (MISCELLANEOUS) ×2 IMPLANT
FORCEPS BIOP RAD 4 LRG CAP 4 (CUTTING FORCEPS) ×2 IMPLANT
GOWN CVR UNV OPN BCK APRN NK (MISCELLANEOUS) ×2 IMPLANT
GOWN ISOL THUMB LOOP REG UNIV (MISCELLANEOUS) ×2
KIT ENDO PROCEDURE OLY (KITS) ×2 IMPLANT
WATER STERILE IRR 250ML POUR (IV SOLUTION) ×2 IMPLANT

## 2017-06-12 NOTE — Anesthesia Procedure Notes (Signed)
Performed by: Janna Arch, CRNA

## 2017-06-12 NOTE — Anesthesia Procedure Notes (Addendum)
Procedure Name: MAC Date/Time: 06/12/2017 10:33 AM Performed by: Janna Arch, CRNA Pre-anesthesia Checklist: Patient identified, Emergency Drugs available, Suction available, Patient being monitored and Timeout performed Patient Re-evaluated:Patient Re-evaluated prior to induction Oxygen Delivery Method: Nasal cannula

## 2017-06-12 NOTE — Anesthesia Postprocedure Evaluation (Signed)
Anesthesia Post Note  Patient: Angela Herrera  Procedure(s) Performed: COLONOSCOPY WITH PROPOFOL (N/A Rectum)  Patient location during evaluation: PACU Anesthesia Type: General Level of consciousness: awake and alert, oriented and patient cooperative Pain management: pain level controlled Vital Signs Assessment: post-procedure vital signs reviewed and stable Respiratory status: spontaneous breathing, nonlabored ventilation and respiratory function stable Cardiovascular status: blood pressure returned to baseline and stable Postop Assessment: adequate PO intake Anesthetic complications: no    Darrin Nipper

## 2017-06-12 NOTE — Op Note (Signed)
[facility] Gastroenterology Patient Name: Angela Herrera Procedure Date: 06/12/2017 10:24 AM MRN: [id number] Account #: [id number] Date of Birth: 08/16/1950 Admit Type: Outpatient Age: 67 Room: [facility] OR ROOM 01 Gender: Female Note Status: Finalized Procedure:            Colonoscopy Indications:          Screening for colorectal malignant neoplasm (last                        colonoscopy was more than 10 years ago), Last                        colonoscopy: April 2008 Providers:            Lin Landsman MD, MD Referring MD:         Halina Maidens, MD (Referring MD) Medicines:            Monitored Anesthesia Care Complications:        No immediate complications. Estimated blood loss: None. Procedure:            Pre-Anesthesia Assessment:                       - Prior to the procedure, a History and Physical was                        performed, and patient medications and allergies were                        reviewed. The patient is competent. The risks and                        benefits of the procedure and the sedation options and                        risks were discussed with the patient. All questions                        were answered and informed consent was obtained.                        Patient identification and proposed procedure were                        verified by the physician, the nurse, the                        anesthesiologist, the anesthetist and the technician in                        the pre-procedure area in the procedure room. Mental                        Status Examination: alert and oriented. Airway                        Examination: normal oropharyngeal airway and neck                        mobility. Respiratory Examination: clear to  auscultation. CV Examination: normal. Prophylactic                        Antibiotics: The patient does not require prophylactic                        antibiotics.  Prior Anticoagulants: The patient has                        taken aspirin, last dose was 1 day prior to procedure.                        ASA Grade Assessment: II - A patient with mild systemic                        disease. After reviewing the risks and benefits, the                        patient was deemed in satisfactory condition to undergo                        the procedure. The anesthesia plan was to use monitored                        anesthesia care (MAC). Immediately prior to                        administration of medications, the patient was                        re-assessed for adequacy to receive sedatives. The                        heart rate, respiratory rate, oxygen saturations, blood                        pressure, adequacy of pulmonary ventilation, and                        response to care were monitored throughout the                        procedure. The physical status of the patient was                        re-assessed after the procedure.                       After obtaining informed consent, the colonoscope was                        passed under direct vision. Throughout the procedure,                        the patient's blood pressure, pulse, and oxygen                        saturations were monitored continuously. The Olympus CF  H180AL Colonoscope (S#: [id number]) was introduced through                        the anus and advanced to the the cecum, identified by                        appendiceal orifice and ileocecal valve. The                        colonoscopy was performed without difficulty. The                        patient tolerated the procedure well. The quality of                        the bowel preparation was adequate to identify polyps. Findings:      The perianal and digital rectal examinations were normal. Pertinent       negatives include normal sphincter tone and no palpable rectal lesions.      A diminutive  polyp was found in the ascending colon. The polyp was       sessile. The polyp was removed with a cold biopsy forceps. Resection and       retrieval were complete.      Multiple small and large-mouthed diverticula were found in the sigmoid       colon, descending colon and transverse colon.      Non-bleeding internal hemorrhoids were found during retroflexion. The       hemorrhoids were medium-sized. Impression:           - One diminutive polyp in the ascending colon, removed                        with a cold biopsy forceps. Resected and retrieved.                       - Diverticulosis in the sigmoid colon, in the                        descending colon and in the transverse colon.                       - Non-bleeding internal hemorrhoids. Recommendation:       - Repeat colonoscopy in 5-10 years for surveillance                        based on pathology results.                       - Discharge patient to home.                       - Resume previous diet today.                       - Continue present medications.                       - Await pathology results. Procedure Code(s):    --- Professional ---  45380, Colonoscopy, flexible; with biopsy, single or                        multiple Diagnosis Code(s):    --- Professional ---                       Z12.11, Encounter for screening for malignant neoplasm                        of colon                       D12.2, Benign neoplasm of ascending colon                       K64.8, Other hemorrhoids                       K57.30, Diverticulosis of large intestine without                        perforation or abscess without bleeding CPT copyright 2016 American Medical Association. All rights reserved. The codes documented in this report are preliminary and upon coder review may  be revised to meet current compliance requirements. Dr. Ulyess Mort Lin Landsman MD, MD 06/12/2017 10:58:52 AM This report has  been signed electronically. Number of Addenda: 0 Note Initiated On: 06/12/2017 10:24 AM Scope Withdrawal Time: 0 hours 10 minutes 9 seconds  Total Procedure Duration: 0 hours 15 minutes 21 seconds       [facility]

## 2017-06-12 NOTE — H&P (Signed)
Angela Darby, MD [address]  [place]  [place], [place] [address]  Main: [phone]  Fax: [phone] Pager: [phone]  Primary Care Physician:  Angela Hess, MD Primary Gastroenterologist:  Dr. Cephas Herrera  Pre-Procedure History & Physical: HPI:  Angela Herrera is a 67 y.o. female is here for an colonoscopy.   Past Medical History:  Diagnosis Date  . GERD (gastroesophageal reflux disease)   . Hyperlipidemia   . Hypertension   . Peripheral vascular disease ([place])    right leg    Past Surgical History:  Procedure Laterality Date  . COLONOSCOPY  08/05/2006   normal  . TUBAL LIGATION Bilateral     Prior to Admission medications   Medication Sig Start Date End Date Taking? Authorizing Provider  amLODipine (NORVASC) 5 MG tablet TAKE 1 TABLET(5 MG) BY MOUTH DAILY 12/17/16  Yes Angela Hess, MD  aspirin 81 MG tablet Take 81 mg by mouth daily.   Yes [provider]  atorvastatin (LIPITOR) 20 MG tablet TAKE 1 TABLET(20 MG) BY MOUTH DAILY 08/24/16  Yes Angela Hess, MD  lisinopril (PRINIVIL,ZESTRIL) 10 MG tablet TAKE 1 TABLET(10 MG) BY MOUTH DAILY 10/15/16  Yes Angela Hess, MD  Multiple Vitamins-Minerals (MULTIVITAMIN WITH MINERALS) tablet Take 1 tablet daily by mouth.   Yes [provider]  omeprazole (PRILOSEC) 40 MG capsule TAKE 1 CAPSULE(40 MG) BY MOUTH DAILY 10/15/16  Yes Angela Hess, MD  varenicline (CHANTIX STARTING MONTH PAK) 0.5 MG X 11 & 1 MG X 42 tablet Take one 0.5 mg tablet by mouth once daily for 3 days, then increase to one 0.5 mg tablet twice daily for 4 days, then increase to one 1 mg tablet twice daily. 02/27/17  Yes Angela Hess, MD  vitamin B-12 (CYANOCOBALAMIN) 1000 MCG tablet Take 1,000 mcg by mouth daily.   Yes [provider]  ALPRAZolam Duanne Moron) 1 MG tablet Take 1 mg by mouth at bedtime as needed for anxiety.    [provider]    Allergies as of 05/29/2017 - Review Complete  05/20/2017  Allergen Reaction Noted  . Tetracycline Itching 10/22/2014    Family History  Problem Relation Age of Onset  . Hypertension Mother   . Stroke Mother   . Breast cancer Neg Hx     Social History   Socioeconomic History  . Marital status: Married    Spouse name: Not on file  . Number of children: Not on file  . Years of education: Not on file  . Highest education level: Not on file  Social Needs  . Financial resource strain: Not on file  . Food insecurity - worry: Not on file  . Food insecurity - inability: Not on file  . Transportation needs - medical: Not on file  . Transportation needs - non-medical: Not on file  Occupational History  . Not on file  Tobacco Use  . Smoking status: Current Every Day Smoker    Packs/day: 1.00    Years: 53.00    Pack years: 53.00    Types: Cigarettes  . Smokeless tobacco: Never Used  . Tobacco comment: since age 11  Substance and Sexual Activity  . Alcohol use: Yes    Alcohol/week: 2.4 oz    Types: 4 Shots of liquor per week  . Drug use: No  . Sexual activity: Not on file  Other Topics Concern  . Not on file  Social History Narrative  . Not on file  Review of Systems: See HPI, otherwise negative ROS  Physical Exam: BP 114/70   Pulse 75   Temp 97.9 F (36.6 C) (Temporal)   Resp 16   Ht 5\' 4"  (1.626 m)   Wt 129 lb (58.5 kg)   SpO2 100%   BMI 22.14 kg/m  General:   Alert,  pleasant and cooperative in NAD Head:  Normocephalic and atraumatic. Neck:  Supple; no masses or thyromegaly. Lungs:  Clear throughout to auscultation.    Heart:  Regular rate and rhythm. Abdomen:  Soft, nontender and nondistended. Normal bowel sounds, without guarding, and without rebound.   Neurologic:  Alert and  oriented x4;  grossly normal neurologically.  Impression/Plan: Angela Herrera is here for an colonoscopy to be performed for colon cancer surveillance  Risks, benefits, limitations, and alternatives regarding   colonoscopy have been reviewed with the patient.  Questions have been answered.  All parties agreeable.   Angela Sear, MD  06/12/2017, 9:15 AM

## 2017-06-12 NOTE — Transfer of Care (Signed)
Immediate Anesthesia Transfer of Care Note  Patient: Angela Herrera  Procedure(s) Performed: COLONOSCOPY WITH PROPOFOL (N/A Rectum)  Patient Location: PACU  Anesthesia Type: General  Level of Consciousness: awake, alert  and patient cooperative  Airway and Oxygen Therapy: Patient Spontanous Breathing and Patient connected to supplemental oxygen  Post-op Assessment: Post-op Vital signs reviewed, Patient's Cardiovascular Status Stable, Respiratory Function Stable, Patent Airway and No signs of Nausea or vomiting  Post-op Vital Signs: Reviewed and stable  Complications: No apparent anesthesia complications

## 2017-06-12 NOTE — Anesthesia Preprocedure Evaluation (Signed)
Anesthesia Evaluation  Patient identified by MRN, date of birth, ID band Patient awake    Reviewed: Allergy & Precautions, NPO status , Patient's Chart, lab work & pertinent test results  History of Anesthesia Complications Negative for: history of anesthetic complications  Airway Mallampati: IV  TM Distance: >3 FB Neck ROM: Full    Dental  (+)    Pulmonary Current Smoker (1 ppd),    Pulmonary exam normal breath sounds clear to auscultation       Cardiovascular Exercise Tolerance: Good hypertension, + Peripheral Vascular Disease  Normal cardiovascular exam Rhythm:Regular Rate:Normal     Neuro/Psych negative neurological ROS     GI/Hepatic Neg liver ROS, GERD  ,  Endo/Other  negative endocrine ROS  Renal/GU      Musculoskeletal   Abdominal   Peds  Hematology negative hematology ROS (+)   Anesthesia Other Findings   Reproductive/Obstetrics                             Anesthesia Physical Anesthesia Plan  ASA: II  Anesthesia Plan: General   Post-op Pain Management:    Induction: Intravenous  PONV Risk Score and Plan: 2 and Propofol infusion and TIVA  Airway Management Planned: Natural Airway  Additional Equipment:   Intra-op Plan:   Post-operative Plan:   Informed Consent: I have reviewed the patients History and Physical, chart, labs and discussed the procedure including the risks, benefits and alternatives for the proposed anesthesia with the patient or authorized representative who has indicated his/her understanding and acceptance.     Plan Discussed with: CRNA  Anesthesia Plan Comments:         Anesthesia Quick Evaluation

## 2017-06-17 ENCOUNTER — Encounter: Payer: Self-pay | Admitting: Gastroenterology

## 2017-07-22 ENCOUNTER — Other Ambulatory Visit: Payer: Self-pay | Admitting: Internal Medicine

## 2017-07-22 DIAGNOSIS — I10 Essential (primary) hypertension: Secondary | ICD-10-CM

## 2017-07-26 ENCOUNTER — Ambulatory Visit: Payer: Medicare HMO | Admitting: Gastroenterology

## 2017-08-19 DIAGNOSIS — H5203 Hypermetropia, bilateral: Secondary | ICD-10-CM | POA: Diagnosis not present

## 2017-08-28 ENCOUNTER — Encounter: Payer: Self-pay | Admitting: Internal Medicine

## 2017-08-28 ENCOUNTER — Ambulatory Visit: Payer: Medicare HMO | Admitting: Internal Medicine

## 2017-08-28 VITALS — BP 112/78 | HR 78 | Resp 16 | Ht 64.0 in | Wt 132.0 lb

## 2017-08-28 DIAGNOSIS — I10 Essential (primary) hypertension: Secondary | ICD-10-CM | POA: Diagnosis not present

## 2017-08-28 DIAGNOSIS — J3089 Other allergic rhinitis: Secondary | ICD-10-CM | POA: Diagnosis not present

## 2017-08-28 DIAGNOSIS — I739 Peripheral vascular disease, unspecified: Secondary | ICD-10-CM

## 2017-08-28 NOTE — Progress Notes (Signed)
Date:  08/28/2017   Name:  Angela Herrera   DOB:  [birth year]   MRN:  [id number]   Chief Complaint: Hypertension and Allergies (pollen causing allergy symptoms ) Hypertension  This is a chronic problem. The problem is controlled. Pertinent negatives include no chest pain, headaches, palpitations or shortness of breath. Past treatments include calcium channel blockers and ACE inhibitors. The current treatment provides significant improvement.   Environmental allergies - watery eyes and congestion in head, post nasal drip. She is not taking any medications.  PAD -  Mostly in right leg with mild sx at times. She is walking the dog several times a day which keeps her moving.  She was seen once several years ago by vascular surgery and recommended exercise and quitting smoking.   Review of Systems  Constitutional: Negative for appetite change, fatigue, fever and unexpected weight change.  HENT: Positive for congestion, postnasal drip and sneezing. Negative for ear discharge, tinnitus and trouble swallowing.   Eyes: Positive for discharge. Negative for visual disturbance.  Respiratory: Negative for cough, chest tightness and shortness of breath.   Cardiovascular: Negative for chest pain, palpitations and leg swelling.       Mild right leg claudication  Gastrointestinal: Negative for abdominal pain.  Genitourinary: Negative for dysuria and hematuria.  Musculoskeletal: Negative for arthralgias.  Neurological: Negative for tremors, numbness and headaches.  Psychiatric/Behavioral: Negative for dysphoric mood.    Patient Active Problem List   Diagnosis Date Noted  . Esophageal reflux 09/14/2015  . Peripheral vascular disease ([place]) 02/23/2015  . Tobacco use disorder 02/23/2015  . Hyperlipidemia 10/22/2014  . Environmental and seasonal allergies 10/22/2014  . Charcot's syndrome ([place]) 10/22/2014  . Essential hypertension 10/22/2014    Prior to Admission medications   Medication Sig Start  Date End Date Taking? Authorizing Provider  ALPRAZolam Duanne Moron) 1 MG tablet Take 1 mg by mouth at bedtime as needed for anxiety.   Yes [provider]  amLODipine (NORVASC) 5 MG tablet TAKE 1 TABLET(5 MG) BY MOUTH DAILY 07/22/17  Yes Glean Hess, MD  aspirin 81 MG tablet Take 81 mg by mouth daily.   Yes [provider]  atorvastatin (LIPITOR) 20 MG tablet TAKE 1 TABLET(20 MG) BY MOUTH DAILY 08/24/16  Yes Glean Hess, MD  lisinopril (PRINIVIL,ZESTRIL) 10 MG tablet TAKE 1 TABLET(10 MG) BY MOUTH DAILY 10/15/16  Yes Glean Hess, MD  Multiple Vitamins-Minerals (MULTIVITAMIN WITH MINERALS) tablet Take 1 tablet daily by mouth.   Yes [provider]  omeprazole (PRILOSEC) 40 MG capsule TAKE 1 CAPSULE(40 MG) BY MOUTH DAILY 10/15/16  Yes Glean Hess, MD  vitamin B-12 (CYANOCOBALAMIN) 1000 MCG tablet Take 1,000 mcg by mouth daily.   Yes [provider]  varenicline (CHANTIX STARTING MONTH PAK) 0.5 MG X 11 & 1 MG X 42 tablet Take one 0.5 mg tablet by mouth once daily for 3 days, then increase to one 0.5 mg tablet twice daily for 4 days, then increase to one 1 mg tablet twice daily. Patient not taking: Reported on 08/28/2017 02/27/17   Glean Hess, MD    Allergies  Allergen Reactions  . Tetracycline Itching    Past Surgical History:  Procedure Laterality Date  . COLONOSCOPY  08/05/2006   normal  . COLONOSCOPY WITH PROPOFOL N/A 06/12/2017   Procedure: COLONOSCOPY WITH PROPOFOL;  Surgeon: Lin Landsman, MD;  Location: [facility];  Service: Endoscopy;  Laterality: N/A;  . TUBAL LIGATION Bilateral  Social History   Tobacco Use  . Smoking status: Current Every Day Smoker    Packs/day: 1.00    Years: 53.00    Pack years: 53.00    Types: Cigarettes  . Smokeless tobacco: Never Used  . Tobacco comment: since age 56  Substance Use Topics  . Alcohol use: Yes    Alcohol/week: 2.4 oz    Types: 4 Shots of liquor per week  . Drug  use: No     Medication list has been reviewed and updated.  PHQ 2/9 Scores 02/27/2017 09/14/2015 02/23/2015  PHQ - 2 Score 0 0 0    Physical Exam  Constitutional: She is oriented to person, place, and time. She appears well-developed and well-nourished.  HENT:  Head: Normocephalic.  Right Ear: Tympanic membrane and ear canal normal.  Left Ear: Tympanic membrane and ear canal normal.  Nose: Right sinus exhibits no maxillary sinus tenderness and no frontal sinus tenderness. Left sinus exhibits no maxillary sinus tenderness and no frontal sinus tenderness.  Mouth/Throat: No posterior oropharyngeal erythema.  Eyes: Pupils are equal, round, and reactive to light.  Excessive tearing bilaterally  Neck: Normal range of motion. Neck supple.  Cardiovascular: Normal rate, regular rhythm and normal heart sounds.  Pulses:      Dorsalis pedis pulses are 0 on the right side, and 1+ on the left side.       Posterior tibial pulses are 1+ on the right side, and 2+ on the left side.  Pulmonary/Chest: Effort normal and breath sounds normal. No respiratory distress.  Lymphadenopathy:    She has no cervical adenopathy.  Neurological: She is alert and oriented to person, place, and time.  Skin: Skin is warm and dry. No rash noted.  Psychiatric: She has a normal mood and affect. Her behavior is normal.    BP 112/78   Pulse 78   Resp 16   Ht 5\' 4"  (1.626 m)   Wt 132 lb (59.9 kg)   BMI 22.66 kg/m   Assessment and Plan: 1. Essential hypertension controlled  2. Environmental and seasonal allergies Begin allegra or claritin  3. Peripheral vascular disease ([place]) Continue regular walking/exercise Work on cutting back on smoking   No orders of the defined types were placed in this encounter.   Partially dictated using Editor, commissioning. Any errors are unintentional.  Halina Maidens, MD [facility]  08/28/2017

## 2017-08-28 NOTE — Patient Instructions (Signed)
Claritin 10 mg or Allegra 180 mg - get generic and use once a day

## 2017-10-20 ENCOUNTER — Other Ambulatory Visit: Payer: Self-pay | Admitting: Internal Medicine

## 2017-10-20 DIAGNOSIS — I10 Essential (primary) hypertension: Secondary | ICD-10-CM

## 2017-11-07 ENCOUNTER — Other Ambulatory Visit: Payer: Self-pay | Admitting: Internal Medicine

## 2017-11-07 DIAGNOSIS — K219 Gastro-esophageal reflux disease without esophagitis: Secondary | ICD-10-CM

## 2017-11-15 ENCOUNTER — Other Ambulatory Visit: Payer: Self-pay | Admitting: Internal Medicine

## 2017-11-15 DIAGNOSIS — E7849 Other hyperlipidemia: Secondary | ICD-10-CM

## 2017-12-19 ENCOUNTER — Other Ambulatory Visit: Payer: Self-pay | Admitting: Internal Medicine

## 2017-12-19 DIAGNOSIS — I10 Essential (primary) hypertension: Secondary | ICD-10-CM

## 2018-01-15 ENCOUNTER — Ambulatory Visit (INDEPENDENT_AMBULATORY_CARE_PROVIDER_SITE_OTHER): Payer: Medicare HMO | Admitting: Internal Medicine

## 2018-01-15 ENCOUNTER — Encounter: Payer: Self-pay | Admitting: Internal Medicine

## 2018-01-15 VITALS — BP 124/62 | HR 65 | Ht 64.0 in | Wt 131.0 lb

## 2018-01-15 DIAGNOSIS — L03011 Cellulitis of right finger: Secondary | ICD-10-CM

## 2018-01-15 MED ORDER — SULFAMETHOXAZOLE-TRIMETHOPRIM 800-160 MG PO TABS: 1.0000 | ORAL_TABLET | Freq: Two times a day (BID) | ORAL | 0 refills | Status: AC

## 2018-01-15 NOTE — Progress Notes (Signed)
Date:  01/15/2018   Name:  Angela Herrera   DOB:  1950/05/01   MRN:  [id number]   Chief Complaint: Hand Pain (Started Monday. Rt thumb- sore, swollen, and cannot bend)  Hand Pain   There was no injury mechanism. Pain location: right thumb. The quality of the pain is described as aching. Pertinent negatives include no chest pain.  She was potting plants on Monday without gloves. Does not recall any injury but may have been punctured by something.  She has minimal pain, mainly with flexing the thumb.  No fever or chills. No open area to the skin.  Review of Systems  Constitutional: Negative for chills, fatigue and fever.  Respiratory: Negative for chest tightness and shortness of breath.   Cardiovascular: Negative for chest pain and palpitations.  Musculoskeletal: Positive for arthralgias.  Skin: Positive for color change.    Patient Active Problem List   Diagnosis Date Noted  . Esophageal reflux 09/14/2015  . Peripheral vascular disease ([place]) 02/23/2015  . Tobacco use disorder 02/23/2015  . Hyperlipidemia 10/22/2014  . Environmental and seasonal allergies 10/22/2014  . Charcot's syndrome ([place]) 10/22/2014  . Essential hypertension 10/22/2014    Allergies  Allergen Reactions  . Tetracycline Itching    Past Surgical History:  Procedure Laterality Date  . COLONOSCOPY  08/05/2006   normal  . COLONOSCOPY WITH PROPOFOL N/A 06/12/2017   Procedure: COLONOSCOPY WITH PROPOFOL;  Surgeon: Lin Landsman, MD;  Location: [facility];  Service: Endoscopy;  Laterality: N/A;  . TUBAL LIGATION Bilateral     Social History   Tobacco Use  . Smoking status: Current Every Day Smoker    Packs/day: 1.00    Years: 53.00    Pack years: 53.00    Types: Cigarettes  . Smokeless tobacco: Never Used  . Tobacco comment: since age 47  Substance Use Topics  . Alcohol use: Yes    Alcohol/week: 4.0 standard drinks    Types: 4 Shots of liquor per week  . Drug use: No      Medication list has been reviewed and updated.  Current Meds  Medication Sig  . ALPRAZolam (XANAX) 1 MG tablet Take 1 mg by mouth at bedtime as needed for anxiety.  Marland Kitchen amLODipine (NORVASC) 5 MG tablet TAKE 1 TABLET(5 MG) BY MOUTH DAILY  . aspirin 81 MG tablet Take 81 mg by mouth daily.  Marland Kitchen atorvastatin (LIPITOR) 20 MG tablet TAKE 1 TABLET(20 MG) BY MOUTH DAILY  . lisinopril (PRINIVIL,ZESTRIL) 10 MG tablet TAKE 1 TABLET(10 MG) BY MOUTH DAILY  . Melatonin 3 MG TABS Take by mouth.  . Multiple Vitamins-Minerals (MULTIVITAMIN WITH MINERALS) tablet Take 1 tablet daily by mouth.  Marland Kitchen omeprazole (PRILOSEC) 40 MG capsule TAKE 1 CAPSULE(40 MG) BY MOUTH DAILY  . vitamin B-12 (CYANOCOBALAMIN) 1000 MCG tablet Take 1,000 mcg by mouth daily.    PHQ 2/9 Scores 02/27/2017 09/14/2015 02/23/2015  PHQ - 2 Score 0 0 0    Physical Exam  Constitutional: She is oriented to person, place, and time. She appears well-developed. No distress.  HENT:  Head: Normocephalic and atraumatic.  Pulmonary/Chest: Effort normal. No respiratory distress.  Musculoskeletal: Normal range of motion.  Neurological: She is alert and oriented to person, place, and time.  Skin: Skin is warm and dry. No rash noted.  Right thumb swollen, red, warm with mild swelling into the thenar eminence and medial dorsum of hand No open wound, puncture site seen Joint able to be flexed with moderate  discomfort  Psychiatric: She has a normal mood and affect. Her behavior is normal. Thought content normal.  Nursing note and vitals reviewed.   BP 124/62 (BP Location: Right Arm, Patient Position: Sitting, Cuff Size: Normal)   Pulse 65   Ht 5\' 4"  (1.626 m)   Wt 131 lb (59.4 kg)   SpO2 98%   BMI 22.49 kg/m   Assessment and Plan: 1. Cellulitis of finger of right hand Elevate, nsaids if needed for pain Call for follow up if not significantly improved in 5 days - sulfamethoxazole-trimethoprim (BACTRIM DS,SEPTRA DS) 800-160 MG tablet; Take 1  tablet by mouth 2 (two) times daily for 10 days.  Dispense: 20 tablet; Refill: 0   Partially dictated using Editor, commissioning. Any errors are unintentional.  Halina Maidens, MD [facility]  01/15/2018

## 2018-02-26 NOTE — Progress Notes (Signed)
Date:  02/27/2018   Name:  Angela Herrera   DOB:  [birth year]   MRN:  [id number]   Chief Complaint: Annual Exam Angela Herrera is a 67 y.o. female who presents today for her Complete Annual Exam. She feels well. She reports exercising walking 40 minutes per day. She reports she is sleeping poorly since menopause. Mammogram is due now.  Colonoscopy was done in February 2019. She is scheduled for MAW next week with [facility]. She is due for PPV-23 vaccine (last done in 2014). She has 50+ pack year hx of smoking and is interested in the LDCT screening.  Hypertension  This is a chronic problem. The problem is controlled. Pertinent negatives include no chest pain, headaches, palpitations or shortness of breath. Past treatments include calcium channel blockers and ACE inhibitors. The current treatment provides significant improvement.  Hyperlipidemia  This is a chronic problem. The problem is controlled. Pertinent negatives include no chest pain or shortness of breath. Current antihyperlipidemic treatment includes statins. The current treatment provides significant improvement of lipids.  Gastroesophageal Reflux  She complains of heartburn. She reports no abdominal pain, no chest pain, no coughing or no wheezing. This is a recurrent problem. The problem occurs rarely. Pertinent negatives include no fatigue. She has tried a PPI for the symptoms. The treatment provided significant relief.  Insomnia  Primary symptoms: no sleep disturbance.  The problem occurs every several days. Past treatments include medication (melatonin). The treatment provided moderate relief.  PVD - she is having no sx -walking daily 40 minutes.  No ulcerations, calf pain or swelling.   Review of Systems  Constitutional: Negative for chills, fatigue and fever.  HENT: Negative for congestion, hearing loss, tinnitus, trouble swallowing and voice change.   Eyes: Negative for visual disturbance.  Respiratory: Negative for cough,  chest tightness, shortness of breath and wheezing.   Cardiovascular: Negative for chest pain, palpitations and leg swelling.  Gastrointestinal: Positive for heartburn. Negative for abdominal pain, constipation, diarrhea and vomiting.  Endocrine: Negative for polydipsia and polyuria.  Genitourinary: Negative for dysuria, frequency, genital sores, vaginal bleeding and vaginal discharge.  Musculoskeletal: Negative for arthralgias, gait problem and joint swelling.  Skin: Negative for color change and rash.  Allergic/Immunologic: Negative for environmental allergies.  Neurological: Negative for dizziness, tremors, light-headedness and headaches.  Hematological: Negative for adenopathy. Does not bruise/bleed easily.  Psychiatric/Behavioral: Negative for dysphoric mood and sleep disturbance. The patient has insomnia. The patient is not nervous/anxious.     Patient Active Problem List   Diagnosis Date Noted  . Esophageal reflux 09/14/2015  . Peripheral vascular disease ([place]) 02/23/2015  . Tobacco use disorder 02/23/2015  . Mixed hyperlipidemia 10/22/2014  . Environmental and seasonal allergies 10/22/2014  . Charcot's syndrome 10/22/2014  . Essential hypertension 10/22/2014    Allergies  Allergen Reactions  . Tetracycline Itching    Past Surgical History:  Procedure Laterality Date  . COLONOSCOPY  08/05/2006   normal  . COLONOSCOPY WITH PROPOFOL N/A 06/12/2017   Procedure: COLONOSCOPY WITH PROPOFOL;  Surgeon: Lin Landsman, MD;  Location: [facility];  Service: Endoscopy;  Laterality: N/A;  . TUBAL LIGATION Bilateral     Social History   Tobacco Use  . Smoking status: Current Every Day Smoker    Packs/day: 0.50    Years: 53.00    Pack years: 26.50    Types: Cigarettes  . Smokeless tobacco: Never Used  . Tobacco comment: since age 32-taking chantix   Substance Use Topics  .  Alcohol use: Yes    Alcohol/week: 4.0 standard drinks    Types: 4 Shots of liquor per week   . Drug use: No     Medication list has been reviewed and updated.  Current Meds  Medication Sig  . amLODipine (NORVASC) 5 MG tablet TAKE 1 TABLET(5 MG) BY MOUTH DAILY  . aspirin 81 MG tablet Take 81 mg by mouth daily.  Marland Kitchen atorvastatin (LIPITOR) 20 MG tablet TAKE 1 TABLET(20 MG) BY MOUTH DAILY  . lisinopril (PRINIVIL,ZESTRIL) 10 MG tablet TAKE 1 TABLET(10 MG) BY MOUTH DAILY  . Melatonin 3 MG TABS Take by mouth.  . Multiple Vitamins-Minerals (MULTIVITAMIN WITH MINERALS) tablet Take 1 tablet daily by mouth.  Marland Kitchen omeprazole (PRILOSEC) 40 MG capsule TAKE 1 CAPSULE(40 MG) BY MOUTH DAILY  . vitamin B-12 (CYANOCOBALAMIN) 1000 MCG tablet Take 1,000 mcg by mouth daily.    PHQ 2/9 Scores 02/27/2018 02/27/2017 09/14/2015 02/23/2015  PHQ - 2 Score 0 0 0 0  PHQ- 9 Score 0 - - -   6CIT Screen 02/27/2018  What Year? 0 points  What month? 0 points  What time? 0 points  Count back from 20 0 points  Months in reverse 0 points  Repeat phrase 0 points  Total Score 0    Physical Exam  Constitutional: She is oriented to person, place, and time. She appears well-developed and well-nourished. No distress.  HENT:  Head: Normocephalic and atraumatic.  Right Ear: Tympanic membrane and ear canal normal.  Left Ear: Tympanic membrane and ear canal normal.  Nose: Right sinus exhibits no maxillary sinus tenderness. Left sinus exhibits no maxillary sinus tenderness.  Mouth/Throat: Uvula is midline and oropharynx is clear and moist.  Eyes: Conjunctivae and EOM are normal. Right eye exhibits no discharge. Left eye exhibits no discharge. No scleral icterus.  Neck: Normal range of motion. Carotid bruit is not present. No erythema present. No thyromegaly present.  Cardiovascular: Normal rate, regular rhythm and normal heart sounds.  Pulses:      Dorsalis pedis pulses are 1+ on the right side, and 1+ on the left side.       Posterior tibial pulses are 0 on the right side, and 0 on the left side.  Pulmonary/Chest:  Effort normal. No respiratory distress. She has no wheezes. Right breast exhibits no mass, no nipple discharge, no skin change and no tenderness. Left breast exhibits no mass, no nipple discharge, no skin change and no tenderness.  Abdominal: Soft. Bowel sounds are normal. There is no hepatosplenomegaly. There is no tenderness. There is no CVA tenderness.  Musculoskeletal: Normal range of motion.  Lymphadenopathy:    She has no cervical adenopathy.    She has no axillary adenopathy.  Neurological: She is alert and oriented to person, place, and time. She has normal reflexes. No cranial nerve deficit or sensory deficit.  Skin: Skin is warm, dry and intact. No rash noted.  Psychiatric: She has a normal mood and affect. Her speech is normal and behavior is normal. Thought content normal.  Nursing note and vitals reviewed.   BP 117/76   Pulse 76   Resp 16   Ht 5\' 4"  (1.626 m)   Wt 137 lb (62.1 kg)   SpO2 100%   BMI 23.52 kg/m   Assessment and Plan: 1. Annual physical exam Normal exam - POCT urinalysis dipstick  2. Encounter for screening mammogram for breast cancer - MM 3D SCREEN BREAST BILATERAL; Future  3. Essential hypertension controlled - Comprehensive metabolic panel -  TSH  4. Gastroesophageal reflux disease, esophagitis presence not specified Stable on PPI - CBC with Differential/Platelet  5. Mixed hyperlipidemia Continue statin therapy - Lipid panel  6. Peripheral vascular disease ([place]) Continue walking daily Continue to cut back on smoking  7. Need for vaccination for pneumococcus - Pneumococcal polysaccharide vaccine 23-valent greater than or equal to 2yo subcutaneous/IM  8. Tobacco use disorder LDCT lung referral made   Partially dictated using Dragon software. Any errors are unintentional.  Halina Maidens, MD [facility]  02/27/2018

## 2018-02-27 ENCOUNTER — Encounter: Payer: Self-pay | Admitting: Internal Medicine

## 2018-02-27 ENCOUNTER — Ambulatory Visit (INDEPENDENT_AMBULATORY_CARE_PROVIDER_SITE_OTHER): Payer: Medicare HMO | Admitting: Internal Medicine

## 2018-02-27 VITALS — BP 117/76 | HR 76 | Resp 16 | Ht 64.0 in | Wt 137.0 lb

## 2018-02-27 DIAGNOSIS — I739 Peripheral vascular disease, unspecified: Secondary | ICD-10-CM

## 2018-02-27 DIAGNOSIS — I10 Essential (primary) hypertension: Secondary | ICD-10-CM

## 2018-02-27 DIAGNOSIS — R69 Illness, unspecified: Secondary | ICD-10-CM | POA: Diagnosis not present

## 2018-02-27 DIAGNOSIS — K219 Gastro-esophageal reflux disease without esophagitis: Secondary | ICD-10-CM | POA: Diagnosis not present

## 2018-02-27 DIAGNOSIS — E782 Mixed hyperlipidemia: Secondary | ICD-10-CM | POA: Diagnosis not present

## 2018-02-27 DIAGNOSIS — F17200 Nicotine dependence, unspecified, uncomplicated: Secondary | ICD-10-CM

## 2018-02-27 DIAGNOSIS — Z1231 Encounter for screening mammogram for malignant neoplasm of breast: Secondary | ICD-10-CM | POA: Diagnosis not present

## 2018-02-27 DIAGNOSIS — Z0000 Encounter for general adult medical examination without abnormal findings: Secondary | ICD-10-CM | POA: Diagnosis not present

## 2018-02-27 DIAGNOSIS — Z23 Encounter for immunization: Secondary | ICD-10-CM

## 2018-02-27 LAB — POCT URINALYSIS DIPSTICK
Bilirubin, UA: NEGATIVE
Blood, UA: NEGATIVE
GLUCOSE UA: NEGATIVE
Ketones, UA: NEGATIVE
LEUKOCYTES UA: NEGATIVE
NITRITE UA: NEGATIVE
PROTEIN UA: NEGATIVE
Spec Grav, UA: 1.01 (ref 1.010–1.025)
Urobilinogen, UA: 0.2 E.U./dL
pH, UA: 7 (ref 5.0–8.0)

## 2018-02-27 NOTE — Patient Instructions (Addendum)

## 2018-02-28 LAB — CBC WITH DIFFERENTIAL/PLATELET
BASOS ABS: 0 10*3/uL (ref 0.0–0.2)
Basos: 1 %
EOS (ABSOLUTE): 0.3 10*3/uL (ref 0.0–0.4)
Eos: 5 %
Hematocrit: 42.2 % (ref 34.0–46.6)
Hemoglobin: 13.9 g/dL (ref 11.1–15.9)
IMMATURE GRANS (ABS): 0 10*3/uL (ref 0.0–0.1)
Immature Granulocytes: 0 %
LYMPHS: 42 %
Lymphocytes Absolute: 2.3 10*3/uL (ref 0.7–3.1)
MCH: 28.1 pg (ref 26.6–33.0)
MCHC: 32.9 g/dL (ref 31.5–35.7)
MCV: 85 fL (ref 79–97)
MONOS ABS: 0.6 10*3/uL (ref 0.1–0.9)
Monocytes: 10 %
NEUTROS ABS: 2.4 10*3/uL (ref 1.4–7.0)
Neutrophils: 42 %
Platelets: 466 10*3/uL — ABNORMAL HIGH (ref 150–450)
RBC: 4.94 x10E6/uL (ref 3.77–5.28)
RDW: 13.8 % (ref 12.3–15.4)
WBC: 5.6 10*3/uL (ref 3.4–10.8)

## 2018-02-28 LAB — COMPREHENSIVE METABOLIC PANEL
ALK PHOS: 84 IU/L (ref 39–117)
ALT: 18 IU/L (ref 0–32)
AST: 22 IU/L (ref 0–40)
Albumin/Globulin Ratio: 1.7 (ref 1.2–2.2)
Albumin: 4.5 g/dL (ref 3.6–4.8)
BILIRUBIN TOTAL: 0.5 mg/dL (ref 0.0–1.2)
BUN/Creatinine Ratio: 14 (ref 12–28)
BUN: 10 mg/dL (ref 8–27)
CHLORIDE: 98 mmol/L (ref 96–106)
CO2: 26 mmol/L (ref 20–29)
Calcium: 9.8 mg/dL (ref 8.7–10.3)
Creatinine, Ser: 0.72 mg/dL (ref 0.57–1.00)
GFR calc Af Amer: 100 mL/min/{1.73_m2} (ref >59)
GFR calc non Af Amer: 86 mL/min/{1.73_m2} (ref >59)
GLUCOSE: 88 mg/dL (ref 65–99)
Globulin, Total: 2.6 g/dL (ref 1.5–4.5)
POTASSIUM: 4.6 mmol/L (ref 3.5–5.2)
Sodium: 138 mmol/L (ref 134–144)
TOTAL PROTEIN: 7.1 g/dL (ref 6.0–8.5)

## 2018-02-28 LAB — LIPID PANEL
CHOLESTEROL TOTAL: 177 mg/dL (ref 100–199)
Chol/HDL Ratio: 3.5 ratio (ref 0.0–4.4)
HDL: 50 mg/dL (ref >39)
LDL Calculated: 103 mg/dL — ABNORMAL HIGH (ref 0–99)
TRIGLYCERIDES: 119 mg/dL (ref 0–149)
VLDL CHOLESTEROL CAL: 24 mg/dL (ref 5–40)

## 2018-02-28 LAB — TSH: TSH: 2 u[IU]/mL (ref 0.450–4.500)

## 2018-03-03 ENCOUNTER — Ambulatory Visit: Payer: Medicare HMO

## 2018-03-05 ENCOUNTER — Telehealth: Payer: Self-pay | Admitting: *Deleted

## 2018-03-05 DIAGNOSIS — Z87891 Personal history of nicotine dependence: Secondary | ICD-10-CM

## 2018-03-05 DIAGNOSIS — Z122 Encounter for screening for malignant neoplasm of respiratory organs: Secondary | ICD-10-CM

## 2018-03-05 NOTE — Telephone Encounter (Signed)
Received referral for initial lung cancer screening scan. Contacted patient and obtained smoking history,(current, 52 pack year) as well as answering questions related to screening process. Patient denies signs of lung cancer such as weight loss or hemoptysis. Patient denies comorbidity that would prevent curative treatment if lung cancer were found. Patient is scheduled for shared decision making visit and CT scan on 03/24/18 at 10am.

## 2018-03-10 ENCOUNTER — Ambulatory Visit (INDEPENDENT_AMBULATORY_CARE_PROVIDER_SITE_OTHER): Payer: Medicare HMO

## 2018-03-10 VITALS — BP 150/82 | HR 84 | Temp 98.4°F | Resp 16 | Ht 64.0 in | Wt 136.2 lb

## 2018-03-10 DIAGNOSIS — Z0000 Encounter for general adult medical examination without abnormal findings: Secondary | ICD-10-CM

## 2018-03-10 NOTE — Patient Instructions (Signed)
Angela Herrera , Thank you for taking time to come for your Medicare Wellness Visit. I appreciate your ongoing commitment to your health goals. Please review the following plan we discussed and let me know if I can assist you in the future.   Screening recommendations/referrals: Colonoscopy: completed 06/12/17 repeat in 10 years Mammogram: scheduled 03/24/18  Bone Density: completed 07/09/2006 normal Recommended yearly ophthalmology/optometry visit for glaucoma screening and checkup Recommended yearly dental visit for hygiene and checkup  Vaccinations: Influenza vaccine: postponed Pneumococcal vaccine: done 02/27/18 Tdap vaccine: due - please contact us if cut or scrape or go to pharmacy/health dept. Shingles vaccine: Shingrix discussed. Please contact your insurance company for coverage information.     Advanced directives: Please bring a copy of your health care power of attorney and living will to the office at your convenience.  Conditions/risks identified: If you wish to quit smoking, help is available. For free tobacco cessation program offerings call the [facility] at [phone] or Live Well Line at (302) 333-2710. You may also visit [email or web address] or email [email or web address] for more information on other programs.   1-800-QUIT-NOW  Next appointment: Please follow up in one year for your Medicare Annual Wellness visit.     Preventive Care 33 Years and Older, Female Preventive care refers to lifestyle choices and visits with your health care provider that can promote health and wellness. What does preventive care include?  A yearly physical exam. This is also called an annual well check.  Dental exams once or twice a year.  Routine eye exams. Ask your health care provider how often you should have your eyes checked.  Personal lifestyle choices, including:  Daily care of your teeth and gums.  Regular physical activity.  Eating a healthy  diet.  Avoiding tobacco and drug use.  Limiting alcohol use.  Practicing safe sex.  Taking low-dose aspirin every day.  Taking vitamin and mineral supplements as recommended by your health care provider. What happens during an annual well check? The services and screenings done by your health care provider during your annual well check will depend on your age, overall health, lifestyle risk factors, and family history of disease. Counseling  Your health care provider may ask you questions about your:  Alcohol use.  Tobacco use.  Drug use.  Emotional well-being.  Home and relationship well-being.  Sexual activity.  Eating habits.  History of falls.  Memory and ability to understand (cognition).  Work and work Statistician.  Reproductive health. Screening  You may have the following tests or measurements:  Height, weight, and BMI.  Blood pressure.  Lipid and cholesterol levels. These may be checked every 5 years, or more frequently if you are over 52 years old.  Skin check.  Lung cancer screening. You may have this screening every year starting at age 49 if you have a 30-pack-year history of smoking and currently smoke or have quit within the past 15 years.  Fecal occult blood test (FOBT) of the stool. You may have this test every year starting at age 53.  Flexible sigmoidoscopy or colonoscopy. You may have a sigmoidoscopy every 5 years or a colonoscopy every 10 years starting at age 28.  Hepatitis C blood test.  Hepatitis B blood test.  Sexually transmitted disease (STD) testing.  Diabetes screening. This is done by checking your blood sugar (glucose) after you have not eaten for a while (fasting). You may have this done every 1-3 years.  Bone density scan. This is  done to screen for osteoporosis. You may have this done starting at age 28.  Mammogram. This may be done every 1-2 years. Talk to your health care provider about how often you should have  regular mammograms. Talk with your health care provider about your test results, treatment options, and if necessary, the need for more tests. Vaccines  Your health care provider may recommend certain vaccines, such as:  Influenza vaccine. This is recommended every year.  Tetanus, diphtheria, and acellular pertussis (Tdap, Td) vaccine. You may need a Td booster every 10 years.  Zoster vaccine. You may need this after age 36.  Pneumococcal 13-valent conjugate (PCV13) vaccine. One dose is recommended after age 19.  Pneumococcal polysaccharide (PPSV23) vaccine. One dose is recommended after age 39. Talk to your health care provider about which screenings and vaccines you need and how often you need them. This information is not intended to replace advice given to you by your health care provider. Make sure you discuss any questions you have with your health care provider. Document Released: 05/06/2015 Document Revised: 12/28/2015 Document Reviewed: 02/08/2015 Elsevier Interactive Patient Education  2017 [place] Prevention in the Home Falls can cause injuries. They can happen to people of all ages. There are many things you can do to make your home safe and to help prevent falls. What can I do on the outside of my home?  Regularly fix the edges of walkways and driveways and fix any cracks.  Remove anything that might make you trip as you walk through a door, such as a raised step or threshold.  Trim any bushes or trees on the path to your home.  Use bright outdoor lighting.  Clear any walking paths of anything that might make someone trip, such as rocks or tools.  Regularly check to see if handrails are loose or broken. Make sure that both sides of any steps have handrails.  Any raised decks and porches should have guardrails on the edges.  Have any leaves, snow, or ice cleared regularly.  Use sand or salt on walking paths during winter.  Clean up any spills in your  garage right away. This includes oil or grease spills. What can I do in the bathroom?  Use night lights.  Install grab bars by the toilet and in the tub and shower. Do not use towel bars as grab bars.  Use non-skid mats or decals in the tub or shower.  If you need to sit down in the shower, use a plastic, non-slip stool.  Keep the floor dry. Clean up any water that spills on the floor as soon as it happens.  Remove soap buildup in the tub or shower regularly.  Attach bath mats securely with double-sided non-slip rug tape.  Do not have throw rugs and other things on the floor that can make you trip. What can I do in the bedroom?  Use night lights.  Make sure that you have a light by your bed that is easy to reach.  Do not use any sheets or blankets that are too big for your bed. They should not hang down onto the floor.  Have a firm chair that has side arms. You can use this for support while you get dressed.  Do not have throw rugs and other things on the floor that can make you trip. What can I do in the kitchen?  Clean up any spills right away.  Avoid walking on wet floors.  Keep  items that you use a lot in easy-to-reach places.  If you need to reach something above you, use a strong step stool that has a grab bar.  Keep electrical cords out of the way.  Do not use floor polish or wax that makes floors slippery. If you must use wax, use non-skid floor wax.  Do not have throw rugs and other things on the floor that can make you trip. What can I do with my stairs?  Do not leave any items on the stairs.  Make sure that there are handrails on both sides of the stairs and use them. Fix handrails that are broken or loose. Make sure that handrails are as long as the stairways.  Check any carpeting to make sure that it is firmly attached to the stairs. Fix any carpet that is loose or worn.  Avoid having throw rugs at the top or bottom of the stairs. If you do have throw  rugs, attach them to the floor with carpet tape.  Make sure that you have a light switch at the top of the stairs and the bottom of the stairs. If you do not have them, ask someone to add them for you. What else can I do to help prevent falls?  Wear shoes that:  Do not have high heels.  Have rubber bottoms.  Are comfortable and fit you well.  Are closed at the toe. Do not wear sandals.  If you use a stepladder:  Make sure that it is fully opened. Do not climb a closed stepladder.  Make sure that both sides of the stepladder are locked into place.  Ask someone to hold it for you, if possible.  Clearly mark and make sure that you can see:  Any grab bars or handrails.  First and last steps.  Where the edge of each step is.  Use tools that help you move around (mobility aids) if they are needed. These include:  Canes.  Walkers.  Scooters.  Crutches.  Turn on the lights when you go into a dark area. Replace any light bulbs as soon as they burn out.  Set up your furniture so you have a clear path. Avoid moving your furniture around.  If any of your floors are uneven, fix them.  If there are any pets around you, be aware of where they are.  Review your medicines with your doctor. Some medicines can make you feel dizzy. This can increase your chance of falling. Ask your doctor what other things that you can do to help prevent falls. This information is not intended to replace advice given to you by your health care provider. Make sure you discuss any questions you have with your health care provider. Document Released: 02/03/2009 Document Revised: 09/15/2015 Document Reviewed: 05/14/2014 Elsevier Interactive Patient Education  2017 [employer].

## 2018-03-10 NOTE — Progress Notes (Signed)
Subjective:   Angela Herrera is a 67 y.o. female who presents for Medicare Annual (Subsequent) preventive examination.  Review of Systems:   Cardiac Risk Factors include: advanced age (>63men, >70 women);dyslipidemia;hypertension;smoking/ tobacco exposure     Objective:     Vitals: BP (!) 150/82 (BP Location: Left Arm, Patient Position: Sitting, Cuff Size: Normal)   Pulse 84   Temp 98.4 F (36.9 C) (Oral)   Resp 16   Ht 5\' 4"  (1.626 m)   Wt 136 lb 3.2 oz (61.8 kg)   BMI 23.38 kg/m   Body mass index is 23.38 kg/m.  Advanced Directives 03/10/2018 02/27/2018 06/12/2017 02/28/2016 02/23/2015  Does Patient Have a Medical Advance Directive? Yes No Yes No No  Type of Paramedic of [place];Living will - Living will - -  Does patient want to make changes to medical advance directive? - - No - Patient declined - -  Copy of [place] in Chart? No - copy requested - - - -  Would patient like information on creating a medical advance directive? - - - - Yes - Educational materials given    Tobacco Social History   Tobacco Use  Smoking Status Current Every Day Smoker  . Packs/day: 0.50  . Years: 53.00  . Pack years: 26.50  . Types: Cigarettes  Smokeless Tobacco Never Used  Tobacco Comment   since age 51     Ready to quit: Yes Counseling given: Yes Comment: since age 51   Clinical Intake:  Pre-visit preparation completed: Yes  Pain : No/denies pain     Nutritional Status: BMI of 19-24  Normal Diabetes: No  How often do you need to have someone help you when you read instructions, pamphlets, or other written materials from your doctor or pharmacy?: 1 - Never What is the last grade level you completed in school?: some college  Interpreter Needed?: No  Information entered by :: Clemetine Marker LPN  Past Medical History:  Diagnosis Date  . GERD (gastroesophageal reflux disease)   . Hyperlipidemia   . Hypertension   .  Peripheral vascular disease ([place])    right leg   Past Surgical History:  Procedure Laterality Date  . COLONOSCOPY  08/05/2006   normal  . COLONOSCOPY WITH PROPOFOL N/A 06/12/2017   Procedure: COLONOSCOPY WITH PROPOFOL;  Surgeon: Lin Landsman, MD;  Location: [facility];  Service: Endoscopy;  Laterality: N/A;  . TUBAL LIGATION Bilateral    Family History  Problem Relation Age of Onset  . Hypertension Mother   . Stroke Mother   . Breast cancer Neg Hx    Social History   Socioeconomic History  . Marital status: Married    Spouse name: Not on file  . Number of children: 0  . Years of education: Not on file  . Highest education level: Some college, no degree  Occupational History  . Occupation: retired  Scientific laboratory technician  . Financial resource strain: Not hard at all  . Food insecurity:    Worry: Never true    Inability: Never true  . Transportation needs:    Medical: No    Non-medical: No  Tobacco Use  . Smoking status: Current Every Day Smoker    Packs/day: 0.50    Years: 53.00    Pack years: 26.50    Types: Cigarettes  . Smokeless tobacco: Never Used  . Tobacco comment: since age 64  Substance and Sexual Activity  . Alcohol use: Yes  Alcohol/week: 4.0 standard drinks    Types: 4 Shots of liquor per week  . Drug use: No  . Sexual activity: Not on file  Lifestyle  . Physical activity:    Days per week: 7 days    Minutes per session: 20 min  . Stress: Rather much  Relationships  . Social connections:    Talks on phone: More than three times a week    Gets together: More than three times a week    Attends religious service: More than 4 times per year    Active member of club or organization: No    Attends meetings of clubs or organizations: Never    Relationship status: Married  Other Topics Concern  . Not on file  Social History Narrative  . Not on file    Outpatient Encounter Medications as of 03/10/2018  Medication Sig  . amLODipine  (NORVASC) 5 MG tablet TAKE 1 TABLET(5 MG) BY MOUTH DAILY  . aspirin 81 MG tablet Take 81 mg by mouth daily.  Marland Kitchen atorvastatin (LIPITOR) 20 MG tablet TAKE 1 TABLET(20 MG) BY MOUTH DAILY  . lisinopril (PRINIVIL,ZESTRIL) 10 MG tablet TAKE 1 TABLET(10 MG) BY MOUTH DAILY  . Melatonin 3 MG TABS Take by mouth.  . Multiple Vitamins-Minerals (MULTIVITAMIN WITH MINERALS) tablet Take 1 tablet daily by mouth.  Marland Kitchen omeprazole (PRILOSEC) 40 MG capsule TAKE 1 CAPSULE(40 MG) BY MOUTH DAILY  . ALPRAZolam (XANAX) 1 MG tablet Take 1 mg by mouth at bedtime as needed for anxiety.  . vitamin B-12 (CYANOCOBALAMIN) 1000 MCG tablet Take 1,000 mcg by mouth daily.   No facility-administered encounter medications on file as of 03/10/2018.     Activities of Daily Living In your present state of health, do you have any difficulty performing the following activities: 03/10/2018 02/27/2018  Hearing? N N  Comment declines hearing aids -  Vision? N N  Comment wears glasses for driving -  Difficulty concentrating or making decisions? N N  Walking or climbing stairs? N N  Dressing or bathing? N N  Doing errands, shopping? N N  Preparing Food and eating ? N -  Using the Toilet? N -  In the past six months, have you accidently leaked urine? N -  Do you have problems with loss of bowel control? N -  Managing your Medications? N -  Managing your Finances? N -  Housekeeping or managing your Housekeeping? N -  Some recent data might be hidden    Patient Care Team: Glean Hess, MD as PCP - General (Family Medicine)    Assessment:   This is a routine wellness examination for Angela Herrera.  Exercise Activities and Dietary recommendations Current Exercise Habits: Home exercise routine, Type of exercise: walking, Time (Minutes): 20, Frequency (Times/Week): 7, Weekly Exercise (Minutes/Week): 140, Intensity: Mild, Exercise limited by: None identified  Goals    . Quit Smoking     Pt would like to quit smoking. Smoking  cessation materials given and information provided about classes and assistance.        Fall Risk Fall Risk  02/27/2018 02/27/2017 09/14/2015 02/23/2015  Falls in the past year? 0 No No No   FALL RISK PREVENTION PERTAINING TO THE HOME:  Any stairs in or around the home WITH handrails? Yes  Home free of loose throw rugs in walkways, pet beds, electrical cords, etc? Yes  Adequate lighting in your home to reduce risk of falls? Yes   ASSISTIVE DEVICES UTILIZED TO PREVENT FALLS:  Life  alert? No  Use of a cane, walker or w/c? No  Grab bars in the bathroom? No  Shower chair or bench in shower? Yes  built in bench Elevated toilet seat or a handicapped toilet? No   DME ORDERS:  DME order needed?  No   TIMED UP AND GO:  Was the test performed? Yes .  Length of time to ambulate 10 feet: 6 sec.   GAIT:  Appearance of gait: Gait stead-fast and without the use of an assistive device.   Education: Fall risk prevention has been discussed.  Intervention(s) required? No   Depression Screen PHQ 2/9 Scores 03/10/2018 02/27/2018 02/27/2017 09/14/2015  PHQ - 2 Score 0 0 0 0  PHQ- 9 Score 1 0 - -     Cognitive Function     6CIT Screen 03/10/2018 02/27/2018  What Year? 0 points 0 points  What month? 0 points 0 points  What time? 0 points 0 points  Count back from 20 0 points 0 points  Months in reverse 0 points 0 points  Repeat phrase 0 points 0 points  Total Score 0 0    Immunization History  Administered Date(s) Administered  . Pneumococcal Conjugate-13 02/28/2016  . Pneumococcal Polysaccharide-23 09/24/2012, 02/27/2018  . Zoster 12/08/2013    Qualifies for Shingles Vaccine? Yes  Zostavax completed 12/08/13. Due for Shingrix. Education has been provided regarding the importance of this vaccine. Pt has been advised to call insurance company to determine out of pocket expense. Advised may also receive vaccine at local pharmacy or Health Dept. Verbalized acceptance and  understanding.  Tdap: Although this vaccine is not a covered service during a Wellness Exam, does the patient still wish to receive this vaccine today?  No .  Education has been provided regarding the importance of this vaccine. Advised may receive this vaccine at local pharmacy or Health Dept. Aware to provide a copy of the vaccination record if obtained from local pharmacy or Health Dept. Verbalized acceptance and understanding.  Flu Vaccine: Due for Flu vaccine. Does the patient want to receive this vaccine today?  No . Education has been provided regarding the importance of this vaccine but still declined. Advised may receive this vaccine at local pharmacy or Health Dept. Aware to provide a copy of the vaccination record if obtained from local pharmacy or Health Dept. Verbalized acceptance and understanding. Pt declines flu vaccine.   Pneumococcal Vaccine: Up to date  Screening Tests Health Maintenance  Topic Date Due  . TETANUS/TDAP  06/25/1968  . INFLUENZA VACCINE  02/22/2019 (Originally 11/21/2017)  . MAMMOGRAM  03/19/2018  . COLONOSCOPY  06/13/2027  . DEXA SCAN  Completed  . Hepatitis C Screening  Completed  . PNA vac Low Risk Adult  Completed    Cancer Screenings:  Colorectal Screening: Completed 06/12/17. Repeat every 10 years;   Mammogram: scheduled for 03/24/18  Bone Density: Completed 07/09/2006. Results reflect NORMAL,   Lung Cancer Screening: (Low Dose CT Chest recommended if Age 54-80 years, 30 pack-year currently smoking OR have quit w/in 15years.) does qualify. Scheduled 03/24/18.   Additional Screening:  Hepatitis C Screening: does qualify; Completed 02/28/16  Vision Screening: Recommended annual ophthalmology exams for early detection of glaucoma and other disorders of the eye. Is the patient up to date with their annual eye exam?  Yes  Who is the provider or what is the name of the office in which the pt attends annual eye exams? Dr. Mallie Mussel  Dental Screening:  Recommended annual dental exams  for proper oral hygiene  Community Resource Referral:  CRR required this visit?  No      Plan:    I have personally reviewed and addressed the Medicare Annual Wellness questionnaire and have noted the following in the patient's chart:  A. Medical and social history B. Use of alcohol, tobacco or illicit drugs  C. Current medications and supplements D. Functional ability and status E.  Nutritional status F.  Physical activity G. Advance directives H. List of other physicians I.  Hospitalizations, surgeries, and ER visits in previous 12 months J.  [place] such as hearing and vision if needed, cognitive and depression L. Referrals and appointments   In addition, I have reviewed and discussed with patient certain preventive protocols, quality metrics, and best practice recommendations. A written personalized care plan for preventive services as well as general preventive health recommendations were provided to patient.   Signed,  Clemetine Marker, LPN Nurse Health Advisor   Nurse Notes:Pt would like to quit smoking, states she has tried chantix and nicotine gum in the past with no success. Information provided about [place] smoking cessation program. She is also very nervous about upcoming lung CT scan, not about procedure itself but about potential results. Discussed patient's anxiety regarding this test and coping methods to help relieve stress. Pt appreciative of visit today and will contact us with any questions.

## 2018-03-17 ENCOUNTER — Other Ambulatory Visit: Payer: Self-pay | Admitting: Internal Medicine

## 2018-03-17 DIAGNOSIS — I10 Essential (primary) hypertension: Secondary | ICD-10-CM

## 2018-03-24 ENCOUNTER — Telehealth: Payer: Self-pay | Admitting: *Deleted

## 2018-03-24 ENCOUNTER — Encounter: Payer: Self-pay | Admitting: Nurse Practitioner

## 2018-03-24 ENCOUNTER — Inpatient Hospital Stay: Payer: Medicare HMO | Attending: Nurse Practitioner | Admitting: Nurse Practitioner

## 2018-03-24 ENCOUNTER — Other Ambulatory Visit: Payer: Self-pay | Admitting: Internal Medicine

## 2018-03-24 ENCOUNTER — Ambulatory Visit
Admission: RE | Admit: 2018-03-24 | Discharge: 2018-03-24 | Disposition: A | Discharge status: Home / Self Care | Payer: Medicare HMO | Source: Ambulatory Visit | Attending: Nurse Practitioner | Admitting: Nurse Practitioner

## 2018-03-24 ENCOUNTER — Ambulatory Visit
Admission: RE | Admit: 2018-03-24 | Discharge: 2018-03-24 | Disposition: A | Discharge status: Home / Self Care | Payer: Medicare HMO | Source: Ambulatory Visit | Attending: Internal Medicine | Admitting: Internal Medicine

## 2018-03-24 DIAGNOSIS — Z122 Encounter for screening for malignant neoplasm of respiratory organs: Secondary | ICD-10-CM | POA: Diagnosis not present

## 2018-03-24 DIAGNOSIS — Z1231 Encounter for screening mammogram for malignant neoplasm of breast: Secondary | ICD-10-CM | POA: Insufficient documentation

## 2018-03-24 DIAGNOSIS — F17210 Nicotine dependence, cigarettes, uncomplicated: Secondary | ICD-10-CM | POA: Diagnosis not present

## 2018-03-24 DIAGNOSIS — Z87891 Personal history of nicotine dependence: Secondary | ICD-10-CM

## 2018-03-24 DIAGNOSIS — K769 Liver disease, unspecified: Secondary | ICD-10-CM

## 2018-03-24 DIAGNOSIS — R69 Illness, unspecified: Secondary | ICD-10-CM | POA: Diagnosis not present

## 2018-03-24 DIAGNOSIS — D1803 Hemangioma of intra-abdominal structures: Secondary | ICD-10-CM | POA: Insufficient documentation

## 2018-03-24 NOTE — Progress Notes (Signed)
In accordance with CMS guidelines, patient has met eligibility criteria including age, absence of signs or symptoms of lung cancer.  Social History   Tobacco Use  . Smoking status: Current Every Day Smoker    Packs/day: 1.00    Years: 52.00    Pack years: 52.00    Types: Cigarettes  . Smokeless tobacco: Never Used  . Tobacco comment: since age 67  Substance Use Topics  . Alcohol use: Yes    Alcohol/week: 4.0 standard drinks    Types: 4 Shots of liquor per week  . Drug use: No      A shared decision-making session was conducted prior to the performance of CT scan. This includes one or more decision aids, includes benefits and harms of screening, follow-up diagnostic testing, over-diagnosis, false positive rate, and total radiation exposure.   Counseling on the importance of adherence to annual lung cancer LDCT screening, impact of co-morbidities, and ability or willingness to undergo diagnosis and treatment is imperative for compliance of the program.   Counseling on the importance of continued smoking cessation for former smokers; the importance of smoking cessation for current smokers, and information about tobacco cessation interventions have been given to patient including [place] and [facility] quit [place] programs.   Written order for lung cancer screening with LDCT has been given to the patient and any and all questions have been answered to the best of my abilities.    Yearly follow up will be coordinated by Burgess Estelle, Thoracic Navigator.  Beckey Rutter, DNP, AGNP-C [place] [facility] [phone] (work cell) [phone] (office) 03/24/18 10:50 AM

## 2018-03-24 NOTE — Telephone Encounter (Signed)
Angela Herrera, Angela Herrera MRN: [id number] Identity: Female, 01-May-1950 (67 yrs), Outpatient Accession #: [id number]  Appointment Info   Exam Date  03/24/2018     Department  [facility] [phone] [id number]   [id number] [place] [id number]     Reason for Exam   Lung cancer screen, asymptomatic, current smoker (min. 30 pack-yrs)    Diagnoses   Encounter for screening for malignant neoplasm of respiratory organs Personal history of tobacco use, presenting hazards to health    CT CHEST LUNG CANCER SCREENING LOW DOSE W/O CM  Final Result   CLINICAL DATA: 67 year old female with 52 pack-year history of smoking. Lung cancer screening.  EXAM: CT CHEST WITHOUT CONTRAST LOW-DOSE FOR LUNG CANCER SCREENING  TECHNIQUE: Multidetector CT imaging of the chest was performed following the standard protocol without IV contrast.  COMPARISON: None.  FINDINGS: Cardiovascular: The heart size is normal. No substantial pericardial effusion. Coronary artery calcification is evident. Atherosclerotic calcification is noted in the wall of the thoracic aorta.  Mediastinum/Nodes: No mediastinal lymphadenopathy. No evidence for gross hilar lymphadenopathy although assessment is limited by the lack of intravenous contrast on today's study. The esophagus has normal imaging features. There is no axillary lymphadenopathy.  Lungs/Pleura: The central tracheobronchial airways are patent. Centrilobular emphysema noted bilaterally. Scattered tiny bilateral pulmonary nodules are evident, measuring up to maximum size of approximately 3.1 mm. No pleural effusion.  Upper Abdomen: 5.2 x 5.7 cm focus of heterogeneous attenuation identified in the posterior right liver, concerning for underlying mass lesion.  Musculoskeletal: No worrisome lytic or sclerotic osseous abnormality.  IMPRESSION: 1. Lung-RADS 2S, benign  appearance or behavior. Continue annual screening with low-dose chest CT without contrast in 12 months. 2. A Lung-RADS "S" modifier is used due to the presence of a potentially clinically significant finding, in this case, there is a large heterogeneous lesion in the posterior right liver measuring almost 6 cm. Dedicated CT scan of the abdomen/pelvis with intravenous contrast recommended as initial first evaluation study. 3. Aortic Atherosclerois (ICD10-170.0) 4. Emphysema. (LGX21-J94.9)  These results will be called to the ordering clinician or representative by the Radiologist Assistant, and communication documented in the PACS or zVision Dashboard.   Electronically Signed By: Misty Stanley M.D. On: 03/24/2018 13:53  Signed by Misty Stanley, MD on 03/24/2018 1:53 PM

## 2018-03-28 ENCOUNTER — Ambulatory Visit
Admission: RE | Admit: 2018-03-28 | Discharge: 2018-03-28 | Disposition: A | Discharge status: Home / Self Care | Payer: Medicare HMO | Source: Ambulatory Visit | Attending: Internal Medicine | Admitting: Internal Medicine

## 2018-03-28 ENCOUNTER — Encounter: Payer: Self-pay | Admitting: Internal Medicine

## 2018-03-28 DIAGNOSIS — D1803 Hemangioma of intra-abdominal structures: Secondary | ICD-10-CM | POA: Diagnosis not present

## 2018-03-28 DIAGNOSIS — K769 Liver disease, unspecified: Secondary | ICD-10-CM | POA: Insufficient documentation

## 2018-03-28 DIAGNOSIS — D1809 Hemangioma of other sites: Secondary | ICD-10-CM | POA: Diagnosis not present

## 2018-03-28 MED ORDER — IOPAMIDOL (ISOVUE-370) INJECTION 76%
75.0000 mL | Freq: Once | INTRAVENOUS | Status: AC | PRN
  Administered 2018-03-28: 75 mL via INTRAVENOUS

## 2018-04-14 ENCOUNTER — Other Ambulatory Visit: Payer: Self-pay | Admitting: Internal Medicine

## 2018-04-14 DIAGNOSIS — I10 Essential (primary) hypertension: Secondary | ICD-10-CM

## 2018-04-18 ENCOUNTER — Other Ambulatory Visit: Payer: Self-pay | Admitting: Internal Medicine

## 2018-04-18 DIAGNOSIS — K219 Gastro-esophageal reflux disease without esophagitis: Secondary | ICD-10-CM

## 2018-08-25 ENCOUNTER — Ambulatory Visit: Payer: Medicare HMO | Admitting: Internal Medicine

## 2018-09-09 ENCOUNTER — Other Ambulatory Visit: Payer: Self-pay | Admitting: Internal Medicine

## 2018-09-09 DIAGNOSIS — I10 Essential (primary) hypertension: Secondary | ICD-10-CM

## 2018-09-25 ENCOUNTER — Ambulatory Visit: Payer: Medicare HMO | Admitting: Internal Medicine

## 2018-09-25 ENCOUNTER — Encounter: Payer: Self-pay | Admitting: Internal Medicine

## 2018-09-25 ENCOUNTER — Other Ambulatory Visit: Payer: Self-pay

## 2018-09-25 VITALS — BP 124/78 | HR 71 | Ht 64.0 in | Wt 143.4 lb

## 2018-09-25 DIAGNOSIS — I700 Atherosclerosis of aorta: Secondary | ICD-10-CM

## 2018-09-25 DIAGNOSIS — I10 Essential (primary) hypertension: Secondary | ICD-10-CM

## 2018-09-25 DIAGNOSIS — R69 Illness, unspecified: Secondary | ICD-10-CM | POA: Diagnosis not present

## 2018-09-25 DIAGNOSIS — I739 Peripheral vascular disease, unspecified: Secondary | ICD-10-CM | POA: Diagnosis not present

## 2018-09-25 DIAGNOSIS — F17200 Nicotine dependence, unspecified, uncomplicated: Secondary | ICD-10-CM

## 2018-09-25 NOTE — Progress Notes (Signed)
Date:  09/25/2018   Name:  Angela Herrera   DOB:  10/06/1950   MRN:  [id number]   Chief Complaint: Hypertension  Hypertension  This is a chronic problem. The problem is controlled. Pertinent negatives include no chest pain, headaches, palpitations or shortness of breath. Past treatments include ACE inhibitors and calcium channel blockers. The current treatment provides significant improvement.  Nicotine Dependence  Presents for follow-up visit. Symptoms are negative for fatigue. Her urge triggers include company of smokers. The symptoms have been stable. She smokes 1 pack (unable to quit right now due to Covid-19 restrictions and home isolation) of cigarettes per day.  PVD - she walks 30 minutes twice a day with no leg heaviness, cramping, ulcerations or SOB. She is on statin medication without problems and she takes aspirin 81 mg daily.  Review of Systems  Constitutional: Negative for appetite change, fatigue, fever and unexpected weight change.  HENT: Negative for tinnitus and trouble swallowing.   Eyes: Negative for visual disturbance.  Respiratory: Negative for cough, chest tightness, shortness of breath and wheezing.   Cardiovascular: Negative for chest pain, palpitations and leg swelling.  Gastrointestinal: Negative for abdominal pain.  Endocrine: Negative for polydipsia and polyuria.  Genitourinary: Negative for dysuria and hematuria.  Musculoskeletal: Negative for arthralgias, gait problem and joint swelling.  Neurological: Negative for tremors, numbness and headaches.  Psychiatric/Behavioral: Negative for dysphoric mood.    Patient Active Problem List   Diagnosis Date Noted  . Hemangioma of liver 03/24/2018  . Esophageal reflux 09/14/2015  . Peripheral vascular disease ([place]) 02/23/2015  . Tobacco use disorder 02/23/2015  . Mixed hyperlipidemia 10/22/2014  . Environmental and seasonal allergies 10/22/2014  . Charcot's syndrome 10/22/2014  . Essential hypertension  10/22/2014    Allergies  Allergen Reactions  . Tetracycline Itching    Past Surgical History:  Procedure Laterality Date  . COLONOSCOPY  08/05/2006   normal  . COLONOSCOPY WITH PROPOFOL N/A 06/12/2017   Procedure: COLONOSCOPY WITH PROPOFOL;  Surgeon: Lin Landsman, MD;  Location: [place];  Service: Endoscopy;  Laterality: N/A;  . TUBAL LIGATION Bilateral     Social History   Tobacco Use  . Smoking status: Current Every Day Smoker    Packs/day: 1.00    Years: 52.00    Pack years: 52.00    Types: Cigarettes  . Smokeless tobacco: Never Used  . Tobacco comment: since age 51  Substance Use Topics  . Alcohol use: Yes    Alcohol/week: 4.0 standard drinks    Types: 4 Shots of liquor per week  . Drug use: No     Medication list has been reviewed and updated.  Current Meds  Medication Sig  . ALPRAZolam (XANAX) 1 MG tablet Take 1 mg by mouth at bedtime as needed for anxiety.  Marland Kitchen amLODipine (NORVASC) 5 MG tablet TAKE 1 TABLET(5 MG) BY MOUTH DAILY  . aspirin 81 MG tablet Take 81 mg by mouth daily.  Marland Kitchen atorvastatin (LIPITOR) 20 MG tablet TAKE 1 TABLET(20 MG) BY MOUTH DAILY  . lisinopril (ZESTRIL) 10 MG tablet TAKE 1 TABLET(10 MG) BY MOUTH DAILY  . Melatonin 3 MG TABS Take by mouth.  . Multiple Vitamins-Minerals (MULTIVITAMIN WITH MINERALS) tablet Take 1 tablet daily by mouth.  Marland Kitchen omeprazole (PRILOSEC) 40 MG capsule TAKE 1 CAPSULE(40 MG) BY MOUTH DAILY    PHQ 2/9 Scores 09/25/2018 03/10/2018 02/27/2018 02/27/2017  PHQ - 2 Score 0 0 0 0  PHQ- 9 Score - 1 0 -  BP Readings from Last 3 Encounters:  09/25/18 124/78  03/10/18 (!) 150/82  02/27/18 117/76    Physical Exam Constitutional:      Appearance: Normal appearance.  Eyes:     Pupils: Pupils are equal, round, and reactive to light.  Neck:     Musculoskeletal: Normal range of motion and neck supple.  Cardiovascular:     Rate and Rhythm: Normal rate and regular rhythm.  No extrasystoles are present.     Pulses:          Popliteal pulses are 1+ on the right side and 1+ on the left side.       Dorsalis pedis pulses are 1+ on the right side and 1+ on the left side.     Heart sounds: Normal heart sounds.  Pulmonary:     Effort: Pulmonary effort is normal.     Breath sounds: Normal breath sounds. No wheezing or rhonchi.  Musculoskeletal:     Right lower leg: No edema.     Left lower leg: No edema.  Lymphadenopathy:     Cervical: No cervical adenopathy.  Skin:    General: Skin is warm and dry.     Capillary Refill: Capillary refill takes less than 2 seconds.  Neurological:     Mental Status: She is alert.  Psychiatric:        Attention and Perception: Attention normal.        Mood and Affect: Mood normal.        Behavior: Behavior normal.     Wt Readings from Last 3 Encounters:  09/25/18 143 lb 6.4 oz (65 kg)  03/24/18 136 lb (61.7 kg)  03/10/18 136 lb 3.2 oz (61.8 kg)    BP 124/78   Pulse 71   Ht 5\' 4"  (1.626 m)   Wt 143 lb 6.4 oz (65 kg)   SpO2 98%   BMI 24.61 kg/m   Assessment and Plan: 1. Essential hypertension Check renal function Well controlled on current regimen - Comprehensive metabolic panel  2. Peripheral vascular disease (HCC) No symptoms of claudication or activity limitations Encouraged reduction in smoking and continued exercise to promote vascular health  3. Tobacco use disorder Discussed cutting back with goal to quit completely - pt does not feel that she is ready at this time  4. Aortic atherosclerosis (HCC) Continue statin therapy and ASA   Partially dictated using Editor, commissioning. Any errors are unintentional.  Halina Maidens, MD [facility]  09/25/2018

## 2018-09-26 LAB — COMPREHENSIVE METABOLIC PANEL
ALT: 14 IU/L (ref 0–32)
AST: 18 IU/L (ref 0–40)
Albumin/Globulin Ratio: 1.9 (ref 1.2–2.2)
Albumin: 4.6 g/dL (ref 3.8–4.8)
Alkaline Phosphatase: 87 IU/L (ref 39–117)
BUN/Creatinine Ratio: 17 (ref 12–28)
BUN: 11 mg/dL (ref 8–27)
Bilirubin Total: 0.4 mg/dL (ref 0.0–1.2)
CO2: 23 mmol/L (ref 20–29)
Calcium: 9.5 mg/dL (ref 8.7–10.3)
Chloride: 102 mmol/L (ref 96–106)
Creatinine, Ser: 0.63 mg/dL (ref 0.57–1.00)
GFR calc Af Amer: 106 mL/min/{1.73_m2} (ref >59)
GFR calc non Af Amer: 92 mL/min/{1.73_m2} (ref >59)
Globulin, Total: 2.4 g/dL (ref 1.5–4.5)
Glucose: 89 mg/dL (ref 65–99)
Potassium: 4.3 mmol/L (ref 3.5–5.2)
Sodium: 140 mmol/L (ref 134–144)
Total Protein: 7 g/dL (ref 6.0–8.5)

## 2018-10-02 DIAGNOSIS — H5203 Hypermetropia, bilateral: Secondary | ICD-10-CM | POA: Diagnosis not present

## 2018-10-05 ENCOUNTER — Other Ambulatory Visit: Payer: Self-pay | Admitting: Internal Medicine

## 2018-10-05 DIAGNOSIS — I10 Essential (primary) hypertension: Secondary | ICD-10-CM

## 2018-10-09 ENCOUNTER — Other Ambulatory Visit: Payer: Self-pay | Admitting: Internal Medicine

## 2018-10-09 DIAGNOSIS — K219 Gastro-esophageal reflux disease without esophagitis: Secondary | ICD-10-CM

## 2018-11-06 IMAGING — MG DIGITAL SCREENING BILATERAL MAMMOGRAM WITH CAD
4 series · 4 of 4 positions shown · non-contrast
Comparison: Previous exam(s).

CLINICAL DATA: Screening.

EXAM:
DIGITAL SCREENING BILATERAL MAMMOGRAM WITH CAD

[R MLO]
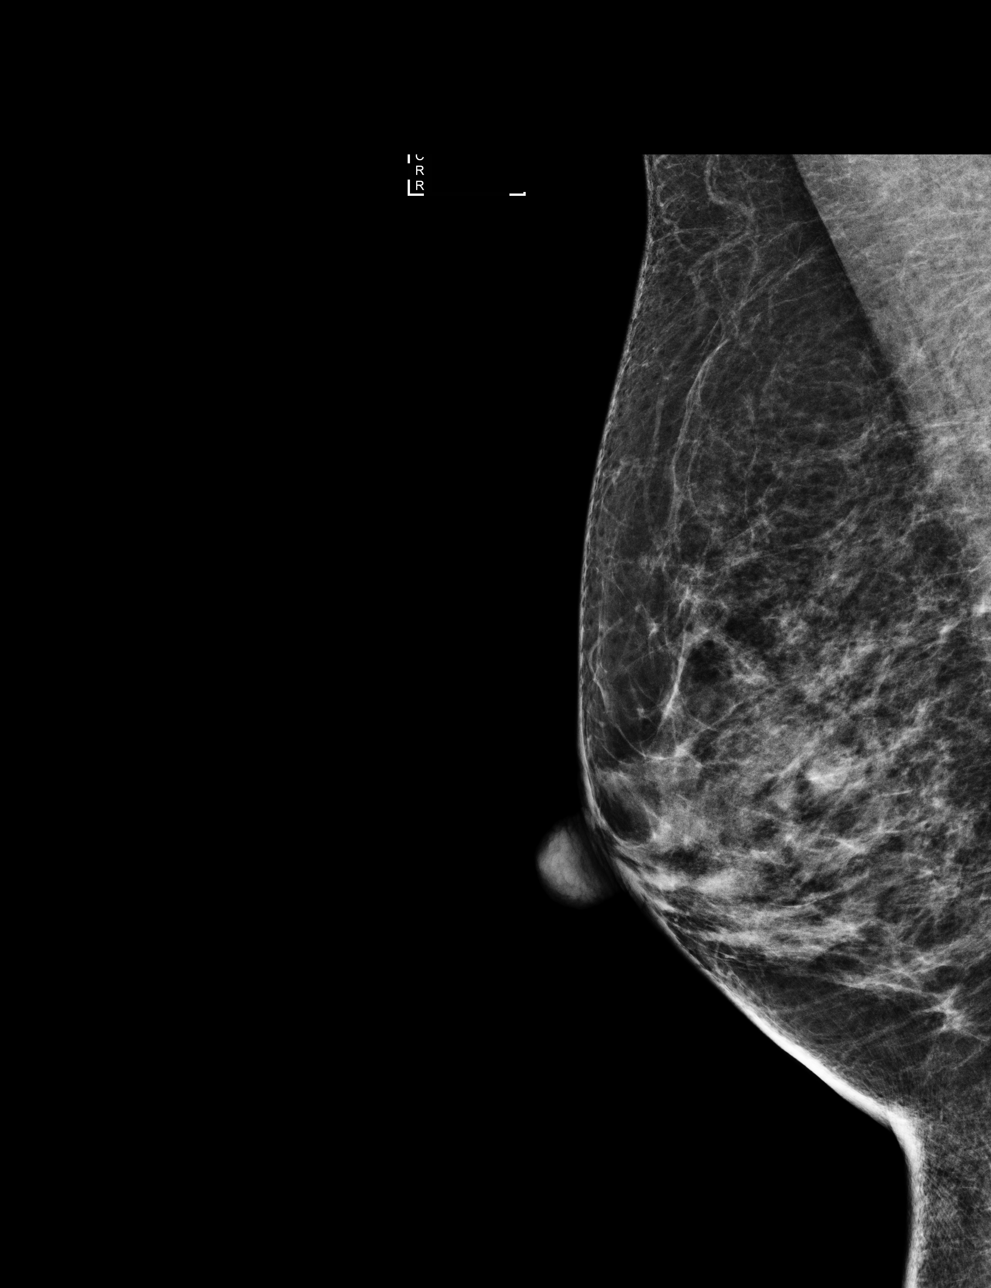

[L CC]
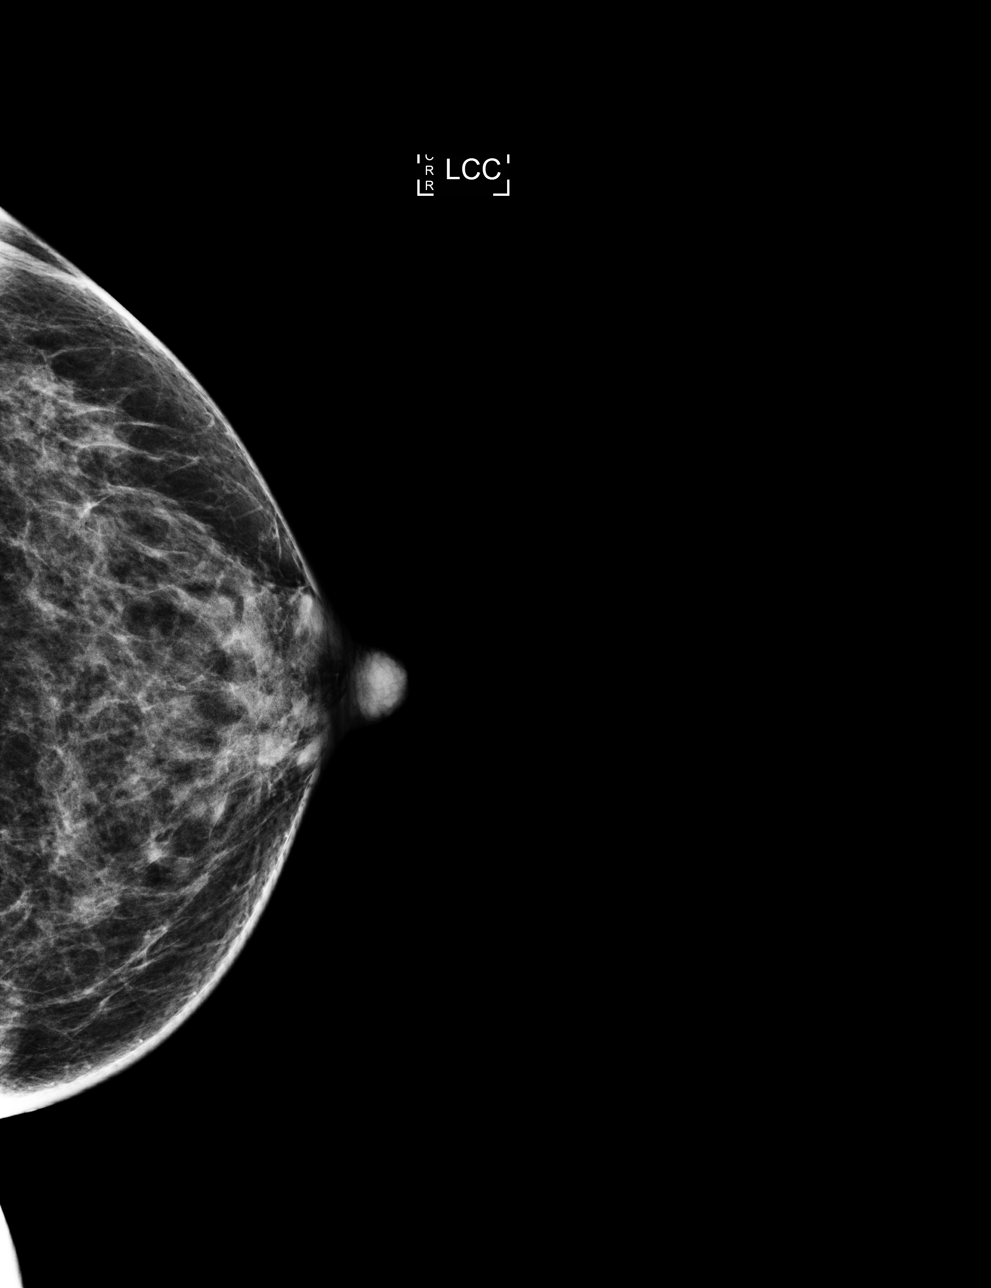

[R CC]
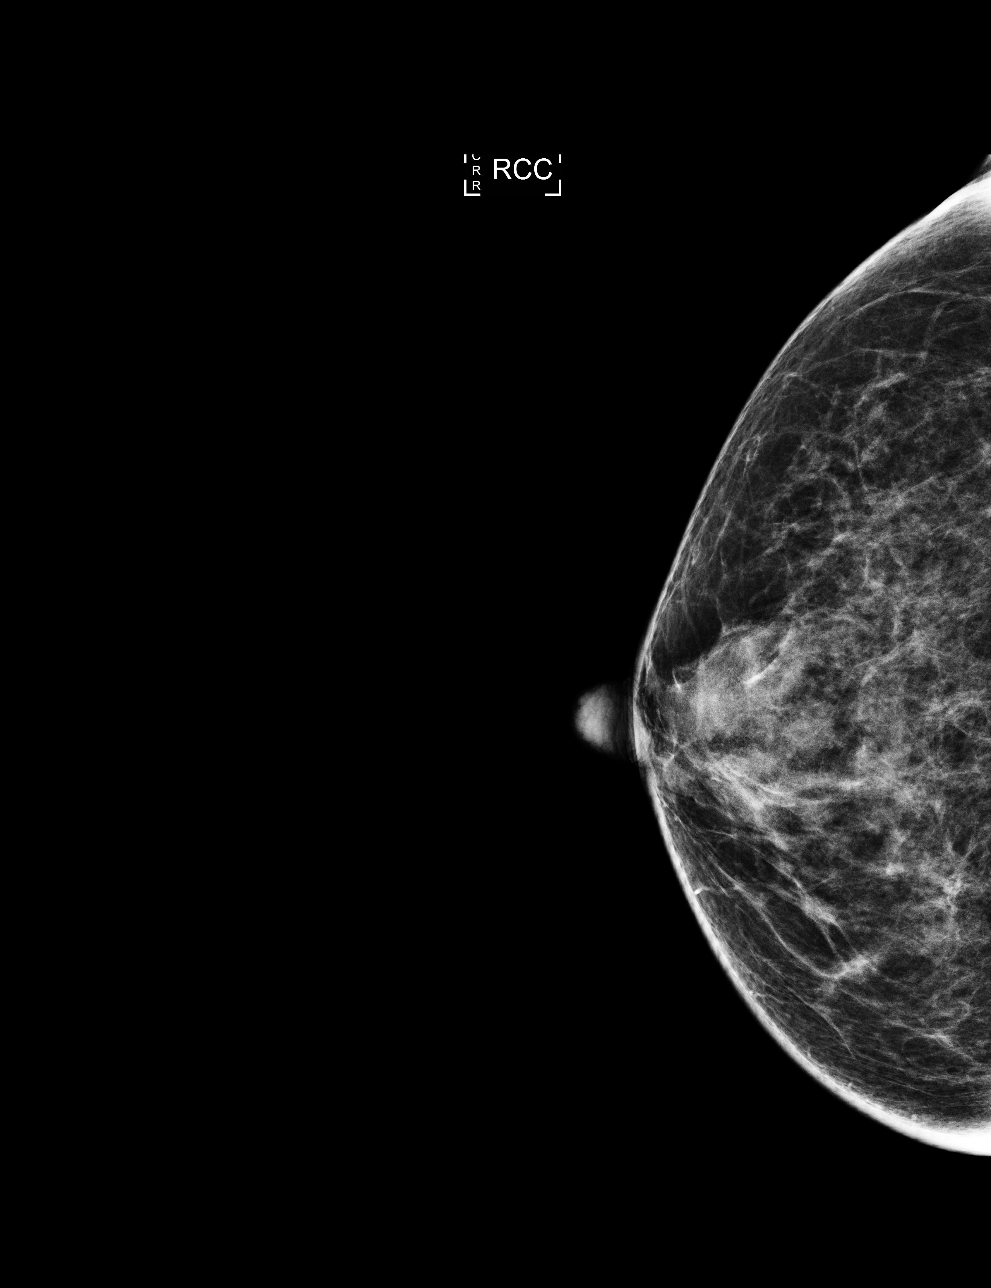

[L MLO]
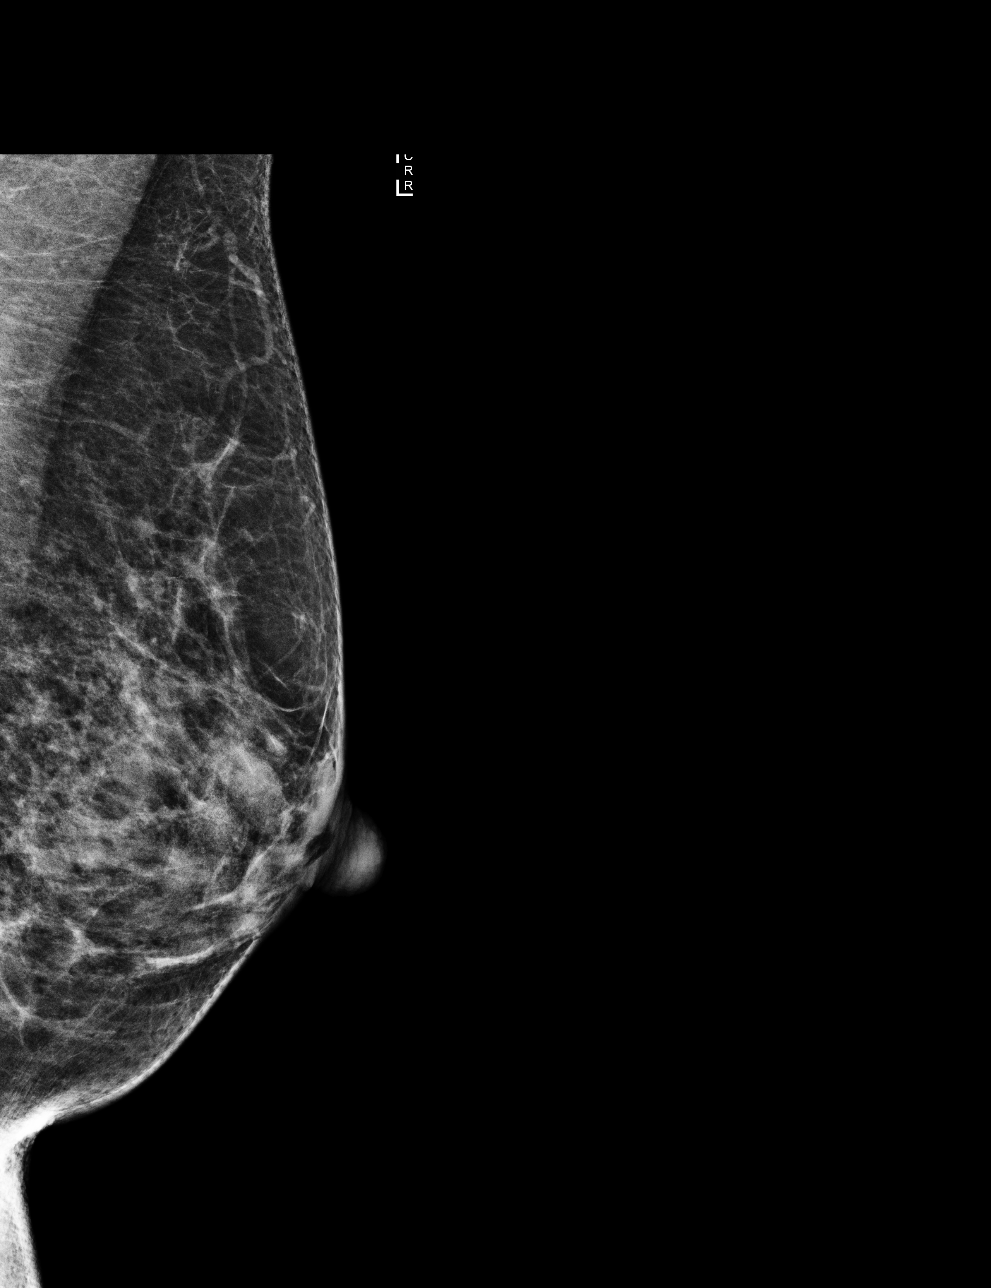

[4 of 4 positions shown; findings below may reference images not displayed]

ACR Breast Density Category c: The breast tissue is heterogeneously
dense, which may obscure small masses.
FINDINGS: There are no findings suspicious for malignancy. Images were
processed with CAD.
IMPRESSION: No mammographic evidence of malignancy. A result letter of this
screening mammogram will be mailed directly to the patient.

RECOMMENDATION:
Screening mammogram in one year. (Code:[id number])

BI-RADS CATEGORY  1: Negative.

## 2018-11-07 ENCOUNTER — Other Ambulatory Visit: Payer: Self-pay | Admitting: Internal Medicine

## 2018-11-07 DIAGNOSIS — E7849 Other hyperlipidemia: Secondary | ICD-10-CM

## 2019-02-03 ENCOUNTER — Other Ambulatory Visit: Payer: Self-pay | Admitting: Internal Medicine

## 2019-02-03 DIAGNOSIS — E7849 Other hyperlipidemia: Secondary | ICD-10-CM

## 2019-03-03 ENCOUNTER — Other Ambulatory Visit: Payer: Self-pay | Admitting: Internal Medicine

## 2019-03-03 DIAGNOSIS — I10 Essential (primary) hypertension: Secondary | ICD-10-CM

## 2019-03-10 ENCOUNTER — Other Ambulatory Visit: Payer: Self-pay | Admitting: Internal Medicine

## 2019-03-10 DIAGNOSIS — Z1231 Encounter for screening mammogram for malignant neoplasm of breast: Secondary | ICD-10-CM

## 2019-03-16 ENCOUNTER — Ambulatory Visit (INDEPENDENT_AMBULATORY_CARE_PROVIDER_SITE_OTHER): Payer: Medicare HMO

## 2019-03-16 VITALS — BP 146/92 | HR 80 | Ht 64.0 in | Wt 136.0 lb

## 2019-03-16 DIAGNOSIS — Z0000 Encounter for general adult medical examination without abnormal findings: Secondary | ICD-10-CM

## 2019-03-16 NOTE — Progress Notes (Addendum)
Subjective:   Angela Herrera is a 68 y.o. female who presents for Medicare Annual (Subsequent) preventive examination.  Virtual Visit via Telephone Note  I connected with [name] on 03/16/19 at  8:40 AM EST by telephone and verified that I am speaking with the correct person using two identifiers.  Medicare Annual Wellness visit completed telephonically due to Covid-19 pandemic.   Location:  Patient: home Provider: office   I discussed the limitations, risks, security and privacy concerns of performing an evaluation and management service by telephone and the availability of in person appointments. The patient expressed understanding and agreed to proceed.  Some vital signs may be absent or patient reported.   Clemetine Marker, LPN    Review of Systems:   Cardiac Risk Factors include: advanced age (>24men, >31 women);hypertension;dyslipidemia     Objective:     Vitals: BP (!) 146/92   Pulse 80   Ht 5\' 4"  (1.626 m)   Wt 136 lb (61.7 kg)   BMI 23.34 kg/m   Body mass index is 23.34 kg/m.  Advanced Directives 03/16/2019 03/10/2018 02/27/2018 06/12/2017 02/28/2016 02/23/2015  Does Patient Have a Medical Advance Directive? No Yes No Yes No No  Type of Advance Directive - [place];Living will - Living will - -  Does patient want to make changes to medical advance directive? - - - No - Patient declined - -  Copy of [place] in Chart? - No - copy requested - - - -  Would patient like information on creating a medical advance directive? Yes (MAU/Ambulatory/Procedural Areas - Information given) - - - - Yes - Educational materials given    Tobacco Social History   Tobacco Use  Smoking Status Former Smoker  . Packs/day: 1.00  . Years: 52.00  . Pack years: 52.00  . Types: Cigarettes  . Quit date: 02/22/2019  . Years since quitting: 0.0  Smokeless Tobacco Never Used  Tobacco Comment   since age 11; quit 02/2019 after dental  work     Counseling given: Not Answered Comment: since age 70; quit 02/2019 after dental work   Clinical Intake:  Pre-visit preparation completed: No  Pain : No/denies pain     BMI - recorded: 23.34 Nutritional Status: BMI of 19-24  Normal Nutritional Risks: None Diabetes: No  How often do you need to have someone help you when you read instructions, pamphlets, or other written materials from your doctor or pharmacy?: 1 - Never  Interpreter Needed?: No  Information entered by :: Clemetine Marker LPN  Past Medical History:  Diagnosis Date  . GERD (gastroesophageal reflux disease)   . Hyperlipidemia   . Hypertension   . Peripheral vascular disease ([place])    right leg   Past Surgical History:  Procedure Laterality Date  . COLONOSCOPY  08/05/2006   normal  . COLONOSCOPY WITH PROPOFOL N/A 06/12/2017   Procedure: COLONOSCOPY WITH PROPOFOL;  Surgeon: Lin Landsman, MD;  Location: [place];  Service: Endoscopy;  Laterality: N/A;  . TUBAL LIGATION Bilateral    Family History  Problem Relation Age of Onset  . Hypertension Mother   . Stroke Mother   . Breast cancer Neg Hx    Social History   Socioeconomic History  . Marital status: Married    Spouse name: Not on file  . Number of children: 0  . Years of education: Not on file  . Highest education level: Some college, no degree  Occupational  History  . Occupation: retired  Scientific laboratory technician  . Financial resource strain: Not hard at all  . Food insecurity    Worry: Never true    Inability: Never true  . Transportation needs    Medical: No    Non-medical: No  Tobacco Use  . Smoking status: Former Smoker    Packs/day: 1.00    Years: 52.00    Pack years: 52.00    Types: Cigarettes    Quit date: 02/22/2019    Years since quitting: 0.0  . Smokeless tobacco: Never Used  . Tobacco comment: since age 21; quit 02/2019 after dental work  Substance and Sexual Activity  . Alcohol use: Yes    Alcohol/week: 4.0  standard drinks    Types: 4 Shots of liquor per week  . Drug use: No  . Sexual activity: Yes  Lifestyle  . Physical activity    Days per week: 7 days    Minutes per session: 20 min  . Stress: Only a little  Relationships  . Social connections    Talks on phone: More than three times a week    Gets together: More than three times a week    Attends religious service: More than 4 times per year    Active member of club or organization: No    Attends meetings of clubs or organizations: Never    Relationship status: Married  Other Topics Concern  . Not on file  Social History Narrative  . Not on file    Outpatient Encounter Medications as of 03/16/2019  Medication Sig  . ALPRAZolam (XANAX) 1 MG tablet Take 1 mg by mouth at bedtime as needed for anxiety.  Marland Kitchen amLODipine (NORVASC) 5 MG tablet TAKE 1 TABLET(5 MG) BY MOUTH DAILY  . aspirin 81 MG tablet Take 81 mg by mouth daily.  Marland Kitchen atorvastatin (LIPITOR) 20 MG tablet TAKE 1 TABLET(20 MG) BY MOUTH DAILY  . cholecalciferol (VITAMIN D3) 25 MCG (1000 UT) tablet Take 1,000 Units by mouth daily.  Marland Kitchen lisinopril (ZESTRIL) 10 MG tablet TAKE 1 TABLET(10 MG) BY MOUTH DAILY  . Multiple Vitamins-Minerals (MULTIVITAMIN WITH MINERALS) tablet Take 1 tablet daily by mouth.  Marland Kitchen omeprazole (PRILOSEC) 40 MG capsule TAKE 1 CAPSULE(40 MG) BY MOUTH DAILY  . [DISCONTINUED] Melatonin 3 MG TABS Take by mouth.   No facility-administered encounter medications on file as of 03/16/2019.     Activities of Daily Living In your present state of health, do you have any difficulty performing the following activities: 03/16/2019  Hearing? N  Comment declines hearing aids  Vision? N  Difficulty concentrating or making decisions? N  Walking or climbing stairs? N  Dressing or bathing? N  Doing errands, shopping? N  Preparing Food and eating ? N  Using the Toilet? N  In the past six months, have you accidently leaked urine? N  Do you have problems with loss of bowel  control? N  Managing your Medications? N  Managing your Finances? N  Housekeeping or managing your Housekeeping? N  Some recent data might be hidden    Patient Care Team: Glean Hess, MD as PCP - General (Internal Medicine)    Assessment:   This is a routine wellness examination for Kashvi.  Exercise Activities and Dietary recommendations Current Exercise Habits: Home exercise routine, Type of exercise: walking, Time (Minutes): 20, Frequency (Times/Week): 7, Weekly Exercise (Minutes/Week): 140, Intensity: Mild, Exercise limited by: None identified  Goals    . Quit Smoking  Pt would like to quit smoking. Smoking cessation materials given and information provided about classes and assistance.        Fall Risk Fall Risk  03/16/2019 09/25/2018 02/27/2018 02/27/2017 09/14/2015  Falls in the past year? 0 0 0 No No  Number falls in past yr: 0 0 - - -  Injury with Fall? 0 0 - - -  Follow up Falls prevention discussed Falls evaluation completed - - -   FALL RISK PREVENTION PERTAINING TO THE HOME:  Any stairs in or around the home? Yes  If so, do they handrails? Yes   Home free of loose throw rugs in walkways, pet beds, electrical cords, etc? Yes  Adequate lighting in your home to reduce risk of falls? Yes   ASSISTIVE DEVICES UTILIZED TO PREVENT FALLS:  Life alert? No  Use of a cane, walker or w/c? No  Grab bars in the bathroom? No  Shower chair or bench in shower? Yes  Elevated toilet seat or a handicapped toilet? No   DME ORDERS:  DME order needed?  No   TIMED UP AND GO:  Was the test performed? No . Telephonic visit.  Education: Fall risk prevention has been discussed.  Intervention(s) required? No   Depression Screen PHQ 2/9 Scores 03/16/2019 09/25/2018 03/10/2018 02/27/2018  PHQ - 2 Score 1 0 0 0  PHQ- 9 Score 4 - 1 0     Cognitive Function - Pt declined 6CIT for 2020 awv     6CIT Screen 03/10/2018 02/27/2018  What Year? 0 points 0 points  What month? 0  points 0 points  What time? 0 points 0 points  Count back from 20 0 points 0 points  Months in reverse 0 points 0 points  Repeat phrase 0 points 0 points  Total Score 0 0    Immunization History  Administered Date(s) Administered  . Pneumococcal Conjugate-13 02/28/2016  . Pneumococcal Polysaccharide-23 09/24/2012, 02/27/2018  . Zoster 12/08/2013    Qualifies for Shingles Vaccine? Yes  Zostavax completed 2015. Due for Shingrix. Education has been provided regarding the importance of this vaccine. Pt has been advised to call insurance company to determine out of pocket expense. Advised may also receive vaccine at local pharmacy or Health Dept. Verbalized acceptance and understanding.  Tdap: Although this vaccine is not a covered service during a Wellness Exam, does the patient still wish to receive this vaccine today?  No .  Education has been provided regarding the importance of this vaccine. Advised may receive this vaccine at local pharmacy or Health Dept. Aware to provide a copy of the vaccination record if obtained from local pharmacy or Health Dept. Verbalized acceptance and understanding.  Flu Vaccine: Due for Flu vaccine. Does the patient want to receive this vaccine today?  No . Education has been provided regarding the importance of this vaccine but still declined. Advised may receive this vaccine at local pharmacy or Health Dept. Aware to provide a copy of the vaccination record if obtained from local pharmacy or Health Dept. Verbalized acceptance and understanding.  Pneumococcal Vaccine: Up to date   Screening Tests Health Maintenance  Topic Date Due  . INFLUENZA VACCINE  11/22/2018  . TETANUS/TDAP  04/22/2020 (Originally 06/25/1969)  . MAMMOGRAM  03/25/2019  . COLONOSCOPY  06/13/2027  . DEXA SCAN  Completed  . Hepatitis C Screening  Completed  . PNA vac Low Risk Adult  Completed   Cancer Screenings:  Colorectal Screening: Completed 06/12/17. Repeat every 10 years;  Mammogram: Completed 03/24/18. Repeat every year; Scheduled for 03/26/19.  Bone Density: Completed 07/09/2006. Results reflect NORMAL. Repeat every 2 years. Pt declines repeat screening at this time.   Lung Cancer Screening: (Low Dose CT Chest recommended if Age 76-80 years, 30 pack-year currently smoking OR have quit w/in 15years.) does qualify. Pt plans to check with insurance to see if it will be covered again.   Additional Screening:  Hepatitis C Screening: does qualify; Completed 02/28/16  Vision Screening: Recommended annual ophthalmology exams for early detection of glaucoma and other disorders of the eye. Is the patient up to date with their annual eye exam?  Yes  Who is the provider or what is the name of the office in which the pt attends annual eye exams? Dr. Mallie Mussel  Dental Screening: Recommended annual dental exams for proper oral hygiene  Community Resource Referral:  CRR required this visit?  No      Plan:    I have personally reviewed and addressed the Medicare Annual Wellness questionnaire and have noted the following in the patient's chart:  A. Medical and social history B. Use of alcohol, tobacco or illicit drugs  C. Current medications and supplements D. Functional ability and status E.  Nutritional status F.  Physical activity G. Advance directives H. List of other physicians I.  Hospitalizations, surgeries, and ER visits in previous 12 months J.  [place] such as hearing and vision if needed, cognitive and depression L. Referrals and appointments   In addition, I have reviewed and discussed with patient certain preventive protocols, quality metrics, and best practice recommendations. A written personalized care plan for preventive services as well as general preventive health recommendations were provided to patient.   Signed,  Clemetine Marker, LPN Nurse Health Advisor   Nurse Notes: blood pressure slightly elevated at start of visit. Able to  hear monitor give readings 1st reading 147/102 2nd reading 146/92. Pt scheduled to follow up with Dr. Army Melia at [facility] yearly on 03/27/19. Pt plans to record reading for 1 week to report at next visit.

## 2019-03-16 NOTE — Patient Instructions (Signed)
Angela Herrera , Thank you for taking time to come for your Medicare Wellness Visit. I appreciate your ongoing commitment to your health goals. Please review the following plan we discussed and let me know if I can assist you in the future.   Screening recommendations/referrals: Colonoscopy: done 06/12/17. Mammogram: done 03/24/18. Scheduled for 03/26/19 Bone Density: done 07/09/2006 Recommended yearly ophthalmology/optometry visit for glaucoma screening and checkup Recommended yearly dental visit for hygiene and checkup  Vaccinations: Influenza vaccine: postponed Pneumococcal vaccine: done 02/27/18 Tdap vaccine: due - please contact us if you get a cut or scrape Shingles vaccine: Shingrix discussed. Please contact your pharmacy for coverage information.   Advanced directives: Advance directive discussed with you today. I have provided a copy for you to complete at home and have notarized. Once this is complete please bring a copy in to our office so we can scan it into your chart.  Conditions/risks identified: Recommend healthy eating and physical activity to maintain blood pressure.   Next appointment: Please follow up in one year for your Medicare Annual Wellness visit.     Preventive Care 74 Years and Older, Female Preventive care refers to lifestyle choices and visits with your health care provider that can promote health and wellness. What does preventive care include?  A yearly physical exam. This is also called an annual well check.  Dental exams once or twice a year.  Routine eye exams. Ask your health care provider how often you should have your eyes checked.  Personal lifestyle choices, including:  Daily care of your teeth and gums.  Regular physical activity.  Eating a healthy diet.  Avoiding tobacco and drug use.  Limiting alcohol use.  Practicing safe sex.  Taking low-dose aspirin every day.  Taking vitamin and mineral supplements as recommended by your health  care provider. What happens during an annual well check? The services and screenings done by your health care provider during your annual well check will depend on your age, overall health, lifestyle risk factors, and family history of disease. Counseling  Your health care provider may ask you questions about your:  Alcohol use.  Tobacco use.  Drug use.  Emotional well-being.  Home and relationship well-being.  Sexual activity.  Eating habits.  History of falls.  Memory and ability to understand (cognition).  Work and work Statistician.  Reproductive health. Screening  You may have the following tests or measurements:  Height, weight, and BMI.  Blood pressure.  Lipid and cholesterol levels. These may be checked every 5 years, or more frequently if you are over 66 years old.  Skin check.  Lung cancer screening. You may have this screening every year starting at age 29 if you have a 30-pack-year history of smoking and currently smoke or have quit within the past 15 years.  Fecal occult blood test (FOBT) of the stool. You may have this test every year starting at age 52.  Flexible sigmoidoscopy or colonoscopy. You may have a sigmoidoscopy every 5 years or a colonoscopy every 10 years starting at age 27.  Hepatitis C blood test.  Hepatitis B blood test.  Sexually transmitted disease (STD) testing.  Diabetes screening. This is done by checking your blood sugar (glucose) after you have not eaten for a while (fasting). You may have this done every 1-3 years.  Bone density scan. This is done to screen for osteoporosis. You may have this done starting at age 22.  Mammogram. This may be done every 1-2 years. Talk to  your health care provider about how often you should have regular mammograms. Talk with your health care provider about your test results, treatment options, and if necessary, the need for more tests. Vaccines  Your health care provider may recommend certain  vaccines, such as:  Influenza vaccine. This is recommended every year.  Tetanus, diphtheria, and acellular pertussis (Tdap, Td) vaccine. You may need a Td booster every 10 years.  Zoster vaccine. You may need this after age 50.  Pneumococcal 13-valent conjugate (PCV13) vaccine. One dose is recommended after age 63.  Pneumococcal polysaccharide (PPSV23) vaccine. One dose is recommended after age 49. Talk to your health care provider about which screenings and vaccines you need and how often you need them. This information is not intended to replace advice given to you by your health care provider. Make sure you discuss any questions you have with your health care provider. Document Released: 05/06/2015 Document Revised: 12/28/2015 Document Reviewed: 02/08/2015 Elsevier Interactive Patient Education  2017 [employer] Prevention in the Home Falls can cause injuries. They can happen to people of all ages. There are many things you can do to make your home safe and to help prevent falls. What can I do on the outside of my home?  Regularly fix the edges of walkways and driveways and fix any cracks.  Remove anything that might make you trip as you walk through a door, such as a raised step or threshold.  Trim any bushes or trees on the path to your home.  Use bright outdoor lighting.  Clear any walking paths of anything that might make someone trip, such as rocks or tools.  Regularly check to see if handrails are loose or broken. Make sure that both sides of any steps have handrails.  Any raised decks and porches should have guardrails on the edges.  Have any leaves, snow, or ice cleared regularly.  Use sand or salt on walking paths during winter.  Clean up any spills in your garage right away. This includes oil or grease spills. What can I do in the bathroom?  Use night lights.  Install grab bars by the toilet and in the tub and shower. Do not use towel bars as grab  bars.  Use non-skid mats or decals in the tub or shower.  If you need to sit down in the shower, use a plastic, non-slip stool.  Keep the floor dry. Clean up any water that spills on the floor as soon as it happens.  Remove soap buildup in the tub or shower regularly.  Attach bath mats securely with double-sided non-slip rug tape.  Do not have throw rugs and other things on the floor that can make you trip. What can I do in the bedroom?  Use night lights.  Make sure that you have a light by your bed that is easy to reach.  Do not use any sheets or blankets that are too big for your bed. They should not hang down onto the floor.  Have a firm chair that has side arms. You can use this for support while you get dressed.  Do not have throw rugs and other things on the floor that can make you trip. What can I do in the kitchen?  Clean up any spills right away.  Avoid walking on wet floors.  Keep items that you use a lot in easy-to-reach places.  If you need to reach something above you, use a strong step stool that has  a grab bar.  Keep electrical cords out of the way.  Do not use floor polish or wax that makes floors slippery. If you must use wax, use non-skid floor wax.  Do not have throw rugs and other things on the floor that can make you trip. What can I do with my stairs?  Do not leave any items on the stairs.  Make sure that there are handrails on both sides of the stairs and use them. Fix handrails that are broken or loose. Make sure that handrails are as long as the stairways.  Check any carpeting to make sure that it is firmly attached to the stairs. Fix any carpet that is loose or worn.  Avoid having throw rugs at the top or bottom of the stairs. If you do have throw rugs, attach them to the floor with carpet tape.  Make sure that you have a light switch at the top of the stairs and the bottom of the stairs. If you do not have them, ask someone to add them for  you. What else can I do to help prevent falls?  Wear shoes that:  Do not have high heels.  Have rubber bottoms.  Are comfortable and fit you well.  Are closed at the toe. Do not wear sandals.  If you use a stepladder:  Make sure that it is fully opened. Do not climb a closed stepladder.  Make sure that both sides of the stepladder are locked into place.  Ask someone to hold it for you, if possible.  Clearly mark and make sure that you can see:  Any grab bars or handrails.  First and last steps.  Where the edge of each step is.  Use tools that help you move around (mobility aids) if they are needed. These include:  Canes.  Walkers.  Scooters.  Crutches.  Turn on the lights when you go into a dark area. Replace any light bulbs as soon as they burn out.  Set up your furniture so you have a clear path. Avoid moving your furniture around.  If any of your floors are uneven, fix them.  If there are any pets around you, be aware of where they are.  Review your medicines with your doctor. Some medicines can make you feel dizzy. This can increase your chance of falling. Ask your doctor what other things that you can do to help prevent falls. This information is not intended to replace advice given to you by your health care provider. Make sure you discuss any questions you have with your health care provider. Document Released: 02/03/2009 Document Revised: 09/15/2015 Document Reviewed: 05/14/2014 Elsevier Interactive Patient Education  2017 [employer].

## 2019-03-18 ENCOUNTER — Telehealth: Payer: Self-pay | Admitting: *Deleted

## 2019-03-18 DIAGNOSIS — Z122 Encounter for screening for malignant neoplasm of respiratory organs: Secondary | ICD-10-CM

## 2019-03-18 DIAGNOSIS — Z87891 Personal history of nicotine dependence: Secondary | ICD-10-CM

## 2019-03-18 NOTE — Addendum Note (Signed)
Addended by: Lieutenant Diego on: 03/18/2019 01:11 PM   Modules accepted: Orders

## 2019-03-18 NOTE — Telephone Encounter (Signed)
Left a voicemail for patient to notify her that it is time to schedule annual low dose lung cancer screening CT scan. Instructed patient to call back to verify information prior to the scan being scheduled. 

## 2019-03-18 NOTE — Telephone Encounter (Signed)
Patient has been notified that annual lung cancer screening low dose CT scan is due currently or will be in near future. Confirmed that patient is within the age range of 55-77, and asymptomatic, (no signs or symptoms of lung cancer). Patient denies illness that would prevent curative treatment for lung cancer if found. Verified smoking history, (former, quit 02/22/19, 53 pack year). The shared decision making visit was done 03/24/18. Patient is agreeable for CT scan being scheduled.

## 2019-03-26 ENCOUNTER — Other Ambulatory Visit: Payer: Self-pay

## 2019-03-26 ENCOUNTER — Ambulatory Visit
Admission: RE | Admit: 2019-03-26 | Discharge: 2019-03-26 | Disposition: A | Discharge status: Home / Self Care | Payer: Medicare HMO | Source: Ambulatory Visit | Attending: Internal Medicine | Admitting: Internal Medicine

## 2019-03-26 DIAGNOSIS — Z1231 Encounter for screening mammogram for malignant neoplasm of breast: Secondary | ICD-10-CM | POA: Diagnosis not present

## 2019-03-27 ENCOUNTER — Ambulatory Visit (INDEPENDENT_AMBULATORY_CARE_PROVIDER_SITE_OTHER): Payer: Medicare HMO | Admitting: Internal Medicine

## 2019-03-27 ENCOUNTER — Encounter: Payer: Self-pay | Admitting: Internal Medicine

## 2019-03-27 VITALS — BP 138/76 | HR 76 | Ht 64.0 in | Wt 138.0 lb

## 2019-03-27 DIAGNOSIS — F17200 Nicotine dependence, unspecified, uncomplicated: Secondary | ICD-10-CM

## 2019-03-27 DIAGNOSIS — I10 Essential (primary) hypertension: Secondary | ICD-10-CM

## 2019-03-27 DIAGNOSIS — Z23 Encounter for immunization: Secondary | ICD-10-CM | POA: Diagnosis not present

## 2019-03-27 DIAGNOSIS — R69 Illness, unspecified: Secondary | ICD-10-CM | POA: Diagnosis not present

## 2019-03-27 DIAGNOSIS — Z0000 Encounter for general adult medical examination without abnormal findings: Secondary | ICD-10-CM | POA: Diagnosis not present

## 2019-03-27 DIAGNOSIS — E782 Mixed hyperlipidemia: Secondary | ICD-10-CM | POA: Diagnosis not present

## 2019-03-27 DIAGNOSIS — Z1231 Encounter for screening mammogram for malignant neoplasm of breast: Secondary | ICD-10-CM | POA: Diagnosis not present

## 2019-03-27 DIAGNOSIS — K219 Gastro-esophageal reflux disease without esophagitis: Secondary | ICD-10-CM | POA: Diagnosis not present

## 2019-03-27 LAB — POCT URINALYSIS DIPSTICK
Bilirubin, UA: NEGATIVE
Blood, UA: NEGATIVE
Glucose, UA: NEGATIVE
Ketones, UA: NEGATIVE
Leukocytes, UA: NEGATIVE
Nitrite, UA: NEGATIVE
Protein, UA: NEGATIVE
Spec Grav, UA: 1.01 (ref 1.010–1.025)
Urobilinogen, UA: 0.2 E.U./dL
pH, UA: 6.5 (ref 5.0–8.0)

## 2019-03-27 MED ORDER — SHINGRIX 50 MCG/0.5ML IM SUSR: 0.5000 mL | Freq: Once | INTRAMUSCULAR | 1 refills | Status: AC

## 2019-03-27 NOTE — Progress Notes (Signed)
Date:  03/27/2019   Name:  Angela Herrera   DOB:  Aug 15, 1950   MRN:  [id number]   Chief Complaint: Medicare Yearly (Mammogram done yesterday. ) [name] is a 68 y.o. female who presents today for her Complete Annual Exam. She feels well. She reports exercising walking 40 min per day. She reports she is sleeping fairly well. She denies breast issues. She recently had her mammogram and MAW.  She declines flu vaccine but wants to get shingrix.  She quit smoking at [facility] but then over thanksgiving she smoked because a visitor was smoking.  She is now smoking about one per day and trying to quit again.  Mammogram 03/2019 DEXA  2008 Colonoscopy  2019 Immunization History  Administered Date(s) Administered  . Pneumococcal Conjugate-13 02/28/2016  . Pneumococcal Polysaccharide-23 09/24/2012, 02/27/2018  . Zoster 12/08/2013    Hypertension This is a chronic problem. The problem is controlled (bp at home 124/79). Pertinent negatives include no chest pain, headaches, palpitations or shortness of breath. Past treatments include ACE inhibitors and calcium channel blockers. The current treatment provides significant improvement.  Gastroesophageal Reflux She complains of heartburn. She reports no abdominal pain, no chest pain, no coughing or no wheezing. The problem occurs rarely. Pertinent negatives include no fatigue. She has tried a PPI for the symptoms. The treatment provided significant relief.  Hyperlipidemia The problem is controlled. Pertinent negatives include no chest pain or shortness of breath. Current antihyperlipidemic treatment includes statins. The current treatment provides significant improvement of lipids.    Lab Results  Component Value Date   CREATININE 0.63 09/25/2018   BUN 11 09/25/2018   NA 140 09/25/2018   K 4.3 09/25/2018   CL 102 09/25/2018   CO2 23 09/25/2018   Lab Results  Component Value Date   CHOL 177 02/27/2018   HDL 50 02/27/2018   LDLCALC  103 (H) 02/27/2018   TRIG 119 02/27/2018   CHOLHDL 3.5 02/27/2018   Lab Results  Component Value Date   TSH 2.000 02/27/2018   No results found for: HGBA1C   Review of Systems  Constitutional: Negative for chills, fatigue and fever.  HENT: Negative for congestion, hearing loss, tinnitus, trouble swallowing and voice change.   Eyes: Negative for visual disturbance.  Respiratory: Negative for cough, chest tightness, shortness of breath and wheezing.   Cardiovascular: Negative for chest pain, palpitations and leg swelling.  Gastrointestinal: Positive for heartburn. Negative for abdominal pain, constipation, diarrhea and vomiting.  Endocrine: Negative for polydipsia and polyuria.  Genitourinary: Negative for dysuria, frequency, genital sores, vaginal bleeding and vaginal discharge.  Musculoskeletal: Negative for arthralgias, gait problem and joint swelling.  Skin: Negative for color change and rash.  Allergic/Immunologic: Negative for environmental allergies.  Neurological: Negative for dizziness, tremors, light-headedness and headaches.  Hematological: Negative for adenopathy. Does not bruise/bleed easily.  Psychiatric/Behavioral: Positive for sleep disturbance. Negative for dysphoric mood. The patient is not nervous/anxious.     Patient Active Problem List   Diagnosis Date Noted  . Aortic atherosclerosis ([place]) 09/25/2018  . Hemangioma of liver 03/24/2018  . GERD without esophagitis 09/14/2015  . Peripheral vascular disease ([place]) 02/23/2015  . Tobacco use disorder 02/23/2015  . Mixed hyperlipidemia 10/22/2014  . Environmental and seasonal allergies 10/22/2014  . Charcot's syndrome 10/22/2014  . Essential hypertension 10/22/2014    Allergies  Allergen Reactions  . Tetracycline Itching    Past Surgical History:  Procedure Laterality Date  . COLONOSCOPY  08/05/2006   normal  .  COLONOSCOPY WITH PROPOFOL N/A 06/12/2017   Procedure: COLONOSCOPY WITH PROPOFOL;  Surgeon: Lin Landsman, MD;  Location: [place];  Service: Endoscopy;  Laterality: N/A;  . TUBAL LIGATION Bilateral     Social History   Tobacco Use  . Smoking status: Former Smoker    Packs/day: 1.00    Years: 52.00    Pack years: 52.00    Types: Cigarettes    Quit date: 02/22/2019    Years since quitting: 0.0  . Smokeless tobacco: Never Used  . Tobacco comment: since age 49; quit 02/2019 after dental work  Substance Use Topics  . Alcohol use: Yes    Alcohol/week: 4.0 standard drinks    Types: 4 Shots of liquor per week  . Drug use: No     Medication list has been reviewed and updated.  Current Meds  Medication Sig  . ALPRAZolam (XANAX) 1 MG tablet Take 1 mg by mouth at bedtime as needed for anxiety.  Marland Kitchen amLODipine (NORVASC) 5 MG tablet TAKE 1 TABLET(5 MG) BY MOUTH DAILY  . aspirin 81 MG tablet Take 81 mg by mouth daily.  Marland Kitchen atorvastatin (LIPITOR) 20 MG tablet TAKE 1 TABLET(20 MG) BY MOUTH DAILY  . cholecalciferol (VITAMIN D3) 25 MCG (1000 UT) tablet Take 1,000 Units by mouth daily.  Marland Kitchen lisinopril (ZESTRIL) 10 MG tablet TAKE 1 TABLET(10 MG) BY MOUTH DAILY  . Multiple Vitamins-Minerals (MULTIVITAMIN WITH MINERALS) tablet Take 1 tablet daily by mouth.    PHQ 2/9 Scores 03/27/2019 03/16/2019 09/25/2018 03/10/2018  PHQ - 2 Score 2 1 0 0  PHQ- 9 Score 8 4 - 1    BP Readings from Last 3 Encounters:  03/27/19 138/76  03/16/19 (!) 146/92  09/25/18 124/78    Physical Exam Vitals signs and nursing note reviewed.  Constitutional:      General: She is not in acute distress.    Appearance: She is well-developed.  HENT:     Head: Normocephalic and atraumatic.     Right Ear: Tympanic membrane and ear canal normal.     Left Ear: Tympanic membrane and ear canal normal.     Nose:     Right Sinus: No maxillary sinus tenderness.     Left Sinus: No maxillary sinus tenderness.  Eyes:     General: No scleral icterus.       Right eye: No discharge.        Left eye: No discharge.      Conjunctiva/sclera: Conjunctivae normal.  Neck:     Musculoskeletal: Normal range of motion. No erythema.     Thyroid: No thyromegaly.     Vascular: No carotid bruit.  Cardiovascular:     Rate and Rhythm: Normal rate and regular rhythm.     Pulses: Normal pulses.     Heart sounds: Normal heart sounds.  Pulmonary:     Effort: Pulmonary effort is normal. No respiratory distress.     Breath sounds: No wheezing.  Abdominal:     General: Bowel sounds are normal.     Palpations: Abdomen is soft.     Tenderness: There is no abdominal tenderness.  Musculoskeletal: Normal range of motion.     Right hip: Normal.     Left hip: Normal.     Right knee: Normal.     Left knee: Normal.     Right lower leg: No edema.     Left lower leg: No edema.  Lymphadenopathy:     Cervical: No cervical adenopathy.  Skin:    General: Skin is warm and dry.     Capillary Refill: Capillary refill takes less than 2 seconds.     Findings: No rash.  Neurological:     General: No focal deficit present.     Mental Status: She is alert and oriented to person, place, and time.     Cranial Nerves: No cranial nerve deficit.     Sensory: No sensory deficit.     Deep Tendon Reflexes: Reflexes are normal and symmetric.  Psychiatric:        Attention and Perception: Attention normal.        Mood and Affect: Mood normal.        Speech: Speech normal.        Behavior: Behavior normal.        Thought Content: Thought content normal.     Wt Readings from Last 3 Encounters:  03/27/19 138 lb (62.6 kg)  03/16/19 136 lb (61.7 kg)  09/25/18 143 lb 6.4 oz (65 kg)    BP 138/76   Pulse 76   Ht 5\' 4"  (1.626 m)   Wt 138 lb (62.6 kg)   SpO2 98%   BMI 23.69 kg/m   Assessment and Plan: 1. Annual physical exam Normal exam Continue healthy diet and regular exercise  2. Encounter for screening mammogram for breast cancer Completed yesterday  3. Essential hypertension Clinically stable exam with well controlled BP.    Tolerating medications, lisinopril and amlodipine, without side effects at this time. Pt to continue current regimen and low sodium diet; benefits of regular exercise as able discussed. - CBC with Differential/Platelet - Comprehensive metabolic panel - TSH  4. Mixed hyperlipidemia Tolerating statin medication without side effects at this time Continue same therapy without change at this time. - Lipid panel  5. GERD without esophagitis Symptoms well controlled on daily PPI No red flag signs such as weight loss, n/v, melena  6. Need for shingles vaccine - Zoster Vaccine Adjuvanted [facility]) injection; Inject 0.5 mLs into the muscle once for 1 dose.  Dispense: 0.5 mL; Refill: 1  7. Tobacco use disorder Pt has cut back considerably - less than one cigarette per day now with the goal to stop completely LDCT screening is scheduled   Partially dictated using Dragon software. Any errors are unintentional.  Halina Maidens, MD [facility]  03/27/2019

## 2019-03-28 LAB — CBC WITH DIFFERENTIAL/PLATELET
Basophils Absolute: 0 10*3/uL (ref 0.0–0.2)
Basos: 1 %
EOS (ABSOLUTE): 0.3 10*3/uL (ref 0.0–0.4)
Eos: 4 %
Hematocrit: 42 % (ref 34.0–46.6)
Hemoglobin: 13.8 g/dL (ref 11.1–15.9)
Immature Grans (Abs): 0 10*3/uL (ref 0.0–0.1)
Immature Granulocytes: 0 %
Lymphocytes Absolute: 2.5 10*3/uL (ref 0.7–3.1)
Lymphs: 39 %
MCH: 28.6 pg (ref 26.6–33.0)
MCHC: 32.9 g/dL (ref 31.5–35.7)
MCV: 87 fL (ref 79–97)
Monocytes Absolute: 0.6 10*3/uL (ref 0.1–0.9)
Monocytes: 9 %
Neutrophils Absolute: 3 10*3/uL (ref 1.4–7.0)
Neutrophils: 47 %
Platelets: 438 10*3/uL (ref 150–450)
RBC: 4.82 x10E6/uL (ref 3.77–5.28)
RDW: 14.3 % (ref 11.7–15.4)
WBC: 6.5 10*3/uL (ref 3.4–10.8)

## 2019-03-28 LAB — COMPREHENSIVE METABOLIC PANEL
ALT: 12 IU/L (ref 0–32)
AST: 15 IU/L (ref 0–40)
Albumin/Globulin Ratio: 1.6 (ref 1.2–2.2)
Albumin: 4.3 g/dL (ref 3.8–4.8)
Alkaline Phosphatase: 92 IU/L (ref 39–117)
BUN/Creatinine Ratio: 13 (ref 12–28)
BUN: 9 mg/dL (ref 8–27)
Bilirubin Total: 0.5 mg/dL (ref 0.0–1.2)
CO2: 24 mmol/L (ref 20–29)
Calcium: 9.7 mg/dL (ref 8.7–10.3)
Chloride: 104 mmol/L (ref 96–106)
Creatinine, Ser: 0.7 mg/dL (ref 0.57–1.00)
GFR calc Af Amer: 102 mL/min/{1.73_m2} (ref >59)
GFR calc non Af Amer: 89 mL/min/{1.73_m2} (ref >59)
Globulin, Total: 2.7 g/dL (ref 1.5–4.5)
Glucose: 88 mg/dL (ref 65–99)
Potassium: 4.6 mmol/L (ref 3.5–5.2)
Sodium: 142 mmol/L (ref 134–144)
Total Protein: 7 g/dL (ref 6.0–8.5)

## 2019-03-28 LAB — LIPID PANEL
Chol/HDL Ratio: 3.6 ratio (ref 0.0–4.4)
Cholesterol, Total: 171 mg/dL (ref 100–199)
HDL: 47 mg/dL (ref >39)
LDL Chol Calc (NIH): 102 mg/dL — ABNORMAL HIGH (ref 0–99)
Triglycerides: 122 mg/dL (ref 0–149)
VLDL Cholesterol Cal: 22 mg/dL (ref 5–40)

## 2019-03-28 LAB — TSH: TSH: 1.68 u[IU]/mL (ref 0.450–4.500)

## 2019-03-30 ENCOUNTER — Ambulatory Visit
Admission: RE | Admit: 2019-03-30 | Discharge: 2019-03-30 | Disposition: A | Discharge status: Home / Self Care | Payer: Medicare HMO | Source: Ambulatory Visit | Attending: Oncology | Admitting: Oncology

## 2019-03-30 ENCOUNTER — Other Ambulatory Visit: Payer: Self-pay

## 2019-03-30 ENCOUNTER — Other Ambulatory Visit: Payer: Self-pay | Admitting: Internal Medicine

## 2019-03-30 DIAGNOSIS — Z122 Encounter for screening for malignant neoplasm of respiratory organs: Secondary | ICD-10-CM | POA: Diagnosis not present

## 2019-03-30 DIAGNOSIS — I10 Essential (primary) hypertension: Secondary | ICD-10-CM

## 2019-03-30 DIAGNOSIS — Z87891 Personal history of nicotine dependence: Secondary | ICD-10-CM

## 2019-04-01 ENCOUNTER — Telehealth: Payer: Self-pay | Admitting: *Deleted

## 2019-04-01 NOTE — Telephone Encounter (Signed)
Notified patient of LDCT lung cancer screening program results with recommendation for 12 month follow up imaging. Also notified of incidental findings noted below and is encouraged to discuss further with PCP who will receive a copy of this note and/or the CT report. Patient verbalizes understanding.   IMPRESSION: Lung-RADS 2, benign appearance or behavior. Continue annual screening with low-dose chest CT without contrast in 12 months.  Giant cavernous hemangioma in the right liver.  The 0 0  Aortic Atherosclerois (ICD10-170.0) and Emphysema. (ICD10-J43.9)

## 2019-04-03 ENCOUNTER — Other Ambulatory Visit: Payer: Self-pay | Admitting: Internal Medicine

## 2019-04-03 DIAGNOSIS — K219 Gastro-esophageal reflux disease without esophagitis: Secondary | ICD-10-CM

## 2019-05-02 ENCOUNTER — Other Ambulatory Visit: Payer: Self-pay | Admitting: Internal Medicine

## 2019-05-02 DIAGNOSIS — E7849 Other hyperlipidemia: Secondary | ICD-10-CM

## 2019-08-27 ENCOUNTER — Other Ambulatory Visit: Payer: Self-pay | Admitting: Internal Medicine

## 2019-08-27 DIAGNOSIS — I10 Essential (primary) hypertension: Secondary | ICD-10-CM

## 2019-09-22 ENCOUNTER — Encounter: Payer: Self-pay | Admitting: Internal Medicine

## 2019-09-22 ENCOUNTER — Other Ambulatory Visit: Payer: Self-pay

## 2019-09-22 ENCOUNTER — Ambulatory Visit: Payer: Medicare HMO | Admitting: Internal Medicine

## 2019-09-22 VITALS — BP 112/70 | HR 70 | Temp 98.2°F | Ht 64.0 in | Wt 132.0 lb

## 2019-09-22 DIAGNOSIS — K219 Gastro-esophageal reflux disease without esophagitis: Secondary | ICD-10-CM | POA: Diagnosis not present

## 2019-09-22 DIAGNOSIS — R630 Anorexia: Secondary | ICD-10-CM

## 2019-09-22 DIAGNOSIS — I739 Peripheral vascular disease, unspecified: Secondary | ICD-10-CM | POA: Diagnosis not present

## 2019-09-22 DIAGNOSIS — I10 Essential (primary) hypertension: Secondary | ICD-10-CM

## 2019-09-22 HISTORY — DX: Anorexia: R63.0

## 2019-09-22 MED ORDER — OMEPRAZOLE 40 MG PO CPDR: DELAYED_RELEASE_CAPSULE | ORAL | 1 refills | Status: DC

## 2019-09-22 NOTE — Progress Notes (Signed)
Date:  09/22/2019   Name:  Angela Herrera   DOB:  April 03, 1951   MRN:  [id number]   Chief Complaint: Hypertension (follow up )  Immunization History  Administered Date(s) Administered  . Moderna SARS-COVID-2 Vaccination 05/29/2019, 06/26/2019  . Pneumococcal Conjugate-13 02/28/2016  . Pneumococcal Polysaccharide-23 09/24/2012, 02/27/2018  . Zoster 12/08/2013  . Zoster Recombinat (Shingrix) 03/27/2019, 05/28/2019    Hypertension The problem is controlled. Pertinent negatives include no chest pain, headaches or shortness of breath. Past treatments include ACE inhibitors and calcium channel blockers. The current treatment provides significant improvement.  Gastroesophageal Reflux She complains of heartburn. She reports no abdominal pain, no chest pain or no coughing. This is a recurrent problem. The problem occurs occasionally. Associated symptoms include fatigue. She has tried a PPI for the symptoms. The treatment provided significant relief.  Change in appetite - since the covid vaccine she has noticed that food does not taste as good and she gets full sooner.  She denies trouble swallowing, change in bowels, vomiting.  She gets hungry but then only eats a small amount, however she will get hungry again in an hour and go eat something else.  She does not think that her weight has changes.  She admits to being tired of cooking at home, would like to get out more than previously.  Lab Results  Component Value Date   CREATININE 0.70 03/27/2019   BUN 9 03/27/2019   NA 142 03/27/2019   K 4.6 03/27/2019   CL 104 03/27/2019   CO2 24 03/27/2019   Lab Results  Component Value Date   CHOL 171 03/27/2019   HDL 47 03/27/2019   LDLCALC 102 (H) 03/27/2019   TRIG 122 03/27/2019   CHOLHDL 3.6 03/27/2019   Lab Results  Component Value Date   TSH 1.680 03/27/2019   No results found for: HGBA1C Lab Results  Component Value Date   WBC 6.5 03/27/2019   HGB 13.8 03/27/2019   HCT 42.0  03/27/2019   MCV 87 03/27/2019   PLT 438 03/27/2019   Lab Results  Component Value Date   ALT 12 03/27/2019   AST 15 03/27/2019   ALKPHOS 92 03/27/2019   BILITOT 0.5 03/27/2019     Review of Systems  Constitutional: Positive for appetite change and fatigue. Negative for diaphoresis, fever and unexpected weight change.  HENT: Negative for trouble swallowing.   Respiratory: Negative for cough, chest tightness and shortness of breath.   Cardiovascular: Negative for chest pain and leg swelling.  Gastrointestinal: Positive for heartburn. Negative for abdominal pain, blood in stool, constipation and diarrhea.  Musculoskeletal: Negative for arthralgias, gait problem and joint swelling.  Neurological: Negative for dizziness and headaches.  Psychiatric/Behavioral: Positive for dysphoric mood and sleep disturbance. The patient is not nervous/anxious.     Patient Active Problem List   Diagnosis Date Noted  . Appetite impaired 09/22/2019  . Aortic atherosclerosis ([place]) 09/25/2018  . Hemangioma of liver 03/24/2018  . GERD without esophagitis 09/14/2015  . Peripheral vascular disease ([place]) 02/23/2015  . Tobacco use disorder 02/23/2015  . Mixed hyperlipidemia 10/22/2014  . Environmental and seasonal allergies 10/22/2014  . Charcot's syndrome 10/22/2014  . Essential hypertension 10/22/2014    Allergies  Allergen Reactions  . Tetracycline Itching    Past Surgical History:  Procedure Laterality Date  . COLONOSCOPY  08/05/2006   normal  . COLONOSCOPY WITH PROPOFOL N/A 06/12/2017   Procedure: COLONOSCOPY WITH PROPOFOL;  Surgeon: Lin Landsman, MD;  [facility];  Service: Endoscopy;  Laterality: N/A;  . TUBAL LIGATION Bilateral     Social History   Tobacco Use  . Smoking status: Former Smoker    Packs/day: 1.00    Years: 52.00    Pack years: 52.00    Types: Cigarettes    Quit date: 02/22/2019    Years since quitting: 0.5  . Smokeless tobacco: Never Used    . Tobacco comment: since age 55; quit 02/2019 after dental work  Substance Use Topics  . Alcohol use: Yes    Alcohol/week: 4.0 standard drinks    Types: 4 Shots of liquor per week  . Drug use: No     Medication list has been reviewed and updated.  Current Meds  Medication Sig  . amLODipine (NORVASC) 5 MG tablet TAKE 1 TABLET(5 MG) BY MOUTH DAILY  . aspirin 81 MG tablet Take 81 mg by mouth daily.  Marland Kitchen atorvastatin (LIPITOR) 20 MG tablet TAKE 1 TABLET(20 MG) BY MOUTH DAILY  . cholecalciferol (VITAMIN D3) 25 MCG (1000 UT) tablet Take 1,000 Units by mouth daily.  Marland Kitchen lisinopril (ZESTRIL) 10 MG tablet TAKE 1 TABLET(10 MG) BY MOUTH DAILY  . Multiple Vitamins-Minerals (MULTIVITAMIN WITH MINERALS) tablet Take 1 tablet daily by mouth.  . [DISCONTINUED] omeprazole (PRILOSEC) 40 MG capsule TAKE 1 CAPSULE(40 MG) BY MOUTH DAILY    PHQ 2/9 Scores 09/22/2019 03/27/2019 03/16/2019 09/25/2018  PHQ - 2 Score 0 2 1 0  PHQ- 9 Score 2 8 4  -    BP Readings from Last 3 Encounters:  09/22/19 112/70  03/27/19 138/76  03/16/19 (!) 146/92    Physical Exam Vitals and nursing note reviewed.  Constitutional:      General: She is not in acute distress.    Appearance: Normal appearance. She is well-developed.  HENT:     Head: Normocephalic and atraumatic.  Cardiovascular:     Rate and Rhythm: Normal rate and regular rhythm.     Heart sounds: No murmur.  Pulmonary:     Effort: Pulmonary effort is normal. No respiratory distress.     Breath sounds: No wheezing or rhonchi.  Musculoskeletal:     Cervical back: Normal range of motion.     Right lower leg: No edema.     Left lower leg: No edema.  Lymphadenopathy:     Cervical: No cervical adenopathy.  Skin:    General: Skin is warm and dry.     Capillary Refill: Capillary refill takes less than 2 seconds.     Findings: No rash.  Neurological:     General: No focal deficit present.     Mental Status: She is alert and oriented to person, place, and time.   Psychiatric:        Mood and Affect: Mood normal.        Behavior: Behavior normal.     Wt Readings from Last 3 Encounters:  09/22/19 132 lb (59.9 kg)  03/30/19 138 lb (62.6 kg)  03/27/19 138 lb (62.6 kg)    BP 112/70   Pulse 70   Temp 98.2 F (36.8 C) (Oral)   Ht 5\' 4"  (1.626 m)   Wt 132 lb (59.9 kg)   SpO2 100%   BMI 22.66 kg/m   Assessment and Plan: 1. Essential hypertension Clinically stable exam with well controlled BP on lisinopril and amlodipine. Tolerating medications without side effects at this time. Pt to continue current regimen and low sodium diet; benefits of regular exercise as able discussed.  2. Gastroesophageal reflux disease Symptoms well controlled on daily PPI No red flag signs such as weight loss, n/v, melena.  Despite change in eating habits she has maintained weight Will continue omeprazole. - oeprazole (PRILOSEC) 40 MG capsule; TAKE 1 CAPSULE(40 MG) BY MOUTH DAILY  Dispense: 90 capsule; Refill: 1  3. Peripheral vascular disease ([place]) Continue daily exercise walking   4. Appetite impaired Pt will monitor - this could be due to depressed mood, SE from vaccine, or normal change in appetite If weight loss occurs and sx do not improve over the next 2 months, return for evaluation   Partially dictated using Dragon software. Any errors are unintentional.  Halina Maidens, MD [facility]  09/22/2019

## 2019-11-24 ENCOUNTER — Other Ambulatory Visit: Payer: Self-pay | Admitting: Internal Medicine

## 2019-11-24 DIAGNOSIS — I10 Essential (primary) hypertension: Secondary | ICD-10-CM

## 2020-02-22 ENCOUNTER — Other Ambulatory Visit: Payer: Self-pay | Admitting: Internal Medicine

## 2020-02-22 DIAGNOSIS — I10 Essential (primary) hypertension: Secondary | ICD-10-CM

## 2020-02-22 DIAGNOSIS — Z1231 Encounter for screening mammogram for malignant neoplasm of breast: Secondary | ICD-10-CM

## 2020-03-17 ENCOUNTER — Other Ambulatory Visit: Payer: Self-pay | Admitting: Internal Medicine

## 2020-03-17 DIAGNOSIS — K219 Gastro-esophageal reflux disease without esophagitis: Secondary | ICD-10-CM

## 2020-03-19 ENCOUNTER — Other Ambulatory Visit: Payer: Self-pay | Admitting: Internal Medicine

## 2020-03-19 DIAGNOSIS — I10 Essential (primary) hypertension: Secondary | ICD-10-CM

## 2020-03-21 ENCOUNTER — Ambulatory Visit (INDEPENDENT_AMBULATORY_CARE_PROVIDER_SITE_OTHER): Payer: Medicare HMO

## 2020-03-21 ENCOUNTER — Other Ambulatory Visit: Payer: Self-pay

## 2020-03-21 VITALS — BP 108/62 | HR 71 | Temp 98.3°F | Resp 16 | Ht 64.0 in | Wt 130.4 lb

## 2020-03-21 DIAGNOSIS — Z0000 Encounter for general adult medical examination without abnormal findings: Secondary | ICD-10-CM

## 2020-03-21 NOTE — Patient Instructions (Signed)
Angela Herrera , Thank you for taking time to come for your Medicare Wellness Visit. I appreciate your ongoing commitment to your health goals. Please review the following plan we discussed and let me know if I can assist you in the future.   Screening recommendations/referrals: Colonoscopy: done 06/12/17. Repeat in 2029.  Mammogram: done 03/26/19. Scheduled for 03/28/20 Bone Density: done 07/09/2006 Recommended yearly ophthalmology/optometry visit for glaucoma screening and checkup Recommended yearly dental visit for hygiene and checkup  Vaccinations: Influenza vaccine: declined Pneumococcal vaccine: done 02/27/18 Tdap vaccine: due Shingles vaccine: done 03/27/19 & 05/28/19   Covid-19:done 05/29/19, 06/26/19 & 02/26/20  Advanced directives: Please bring a copy of your health care power of attorney and living will to the office at your convenience.  Conditions/risks identified: If you wish to quit smoking, help is available. For free tobacco cessation program offerings call the [facility] at 617-041-2042 or Live Well Line at 3347784820. You may also visit [email or web address] or email [email or web address] for more information on other programs.    Next appointment: Follow up in one year for your annual wellness visit    Preventive Care 65 Years and Older, Female Preventive care refers to lifestyle choices and visits with your health care provider that can promote health and wellness. What does preventive care include?  A yearly physical exam. This is also called an annual well check.  Dental exams once or twice a year.  Routine eye exams. Ask your health care provider how often you should have your eyes checked.  Personal lifestyle choices, including:  Daily care of your teeth and gums.  Regular physical activity.  Eating a healthy diet.  Avoiding tobacco and drug use.  Limiting alcohol use.  Practicing safe sex.  Taking low-dose aspirin every day.  Taking  vitamin and mineral supplements as recommended by your health care provider. What happens during an annual well check? The services and screenings done by your health care provider during your annual well check will depend on your age, overall health, lifestyle risk factors, and family history of disease. Counseling  Your health care provider may ask you questions about your:  Alcohol use.  Tobacco use.  Drug use.  Emotional well-being.  Home and relationship well-being.  Sexual activity.  Eating habits.  History of falls.  Memory and ability to understand (cognition).  Work and work Statistician.  Reproductive health. Screening  You may have the following tests or measurements:  Height, weight, and BMI.  Blood pressure.  Lipid and cholesterol levels. These may be checked every 5 years, or more frequently if you are over 32 years old.  Skin check.  Lung cancer screening. You may have this screening every year starting at age 64 if you have a 30-pack-year history of smoking and currently smoke or have quit within the past 15 years.  Fecal occult blood test (FOBT) of the stool. You may have this test every year starting at age 84.  Flexible sigmoidoscopy or colonoscopy. You may have a sigmoidoscopy every 5 years or a colonoscopy every 10 years starting at age 12.  Hepatitis C blood test.  Hepatitis B blood test.  Sexually transmitted disease (STD) testing.  Diabetes screening. This is done by checking your blood sugar (glucose) after you have not eaten for a while (fasting). You may have this done every 1-3 years.  Bone density scan. This is done to screen for osteoporosis. You may have this done starting at age 58.  Mammogram. This may  be done every 1-2 years. Talk to your health care provider about how often you should have regular mammograms. Talk with your health care provider about your test results, treatment options, and if necessary, the need for more  tests. Vaccines  Your health care provider may recommend certain vaccines, such as:  Influenza vaccine. This is recommended every year.  Tetanus, diphtheria, and acellular pertussis (Tdap, Td) vaccine. You may need a Td booster every 10 years.  Zoster vaccine. You may need this after age 41.  Pneumococcal 13-valent conjugate (PCV13) vaccine. One dose is recommended after age 42.  Pneumococcal polysaccharide (PPSV23) vaccine. One dose is recommended after age 67. Talk to your health care provider about which screenings and vaccines you need and how often you need them. This information is not intended to replace advice given to you by your health care provider. Make sure you discuss any questions you have with your health care provider. Document Released: 05/06/2015 Document Revised: 12/28/2015 Document Reviewed: 02/08/2015 Elsevier Interactive Patient Education  2017 [place] Prevention in the Home Falls can cause injuries. They can happen to people of all ages. There are many things you can do to make your home safe and to help prevent falls. What can I do on the outside of my home?  Regularly fix the edges of walkways and driveways and fix any cracks.  Remove anything that might make you trip as you walk through a door, such as a raised step or threshold.  Trim any bushes or trees on the path to your home.  Use bright outdoor lighting.  Clear any walking paths of anything that might make someone trip, such as rocks or tools.  Regularly check to see if handrails are loose or broken. Make sure that both sides of any steps have handrails.  Any raised decks and porches should have guardrails on the edges.  Have any leaves, snow, or ice cleared regularly.  Use sand or salt on walking paths during winter.  Clean up any spills in your garage right away. This includes oil or grease spills. What can I do in the bathroom?  Use night lights.  Install grab bars by the  toilet and in the tub and shower. Do not use towel bars as grab bars.  Use non-skid mats or decals in the tub or shower.  If you need to sit down in the shower, use a plastic, non-slip stool.  Keep the floor dry. Clean up any water that spills on the floor as soon as it happens.  Remove soap buildup in the tub or shower regularly.  Attach bath mats securely with double-sided non-slip rug tape.  Do not have throw rugs and other things on the floor that can make you trip. What can I do in the bedroom?  Use night lights.  Make sure that you have a light by your bed that is easy to reach.  Do not use any sheets or blankets that are too big for your bed. They should not hang down onto the floor.  Have a firm chair that has side arms. You can use this for support while you get dressed.  Do not have throw rugs and other things on the floor that can make you trip. What can I do in the kitchen?  Clean up any spills right away.  Avoid walking on wet floors.  Keep items that you use a lot in easy-to-reach places.  If you need to reach something above you,  use a strong step stool that has a grab bar.  Keep electrical cords out of the way.  Do not use floor polish or wax that makes floors slippery. If you must use wax, use non-skid floor wax.  Do not have throw rugs and other things on the floor that can make you trip. What can I do with my stairs?  Do not leave any items on the stairs.  Make sure that there are handrails on both sides of the stairs and use them. Fix handrails that are broken or loose. Make sure that handrails are as long as the stairways.  Check any carpeting to make sure that it is firmly attached to the stairs. Fix any carpet that is loose or worn.  Avoid having throw rugs at the top or bottom of the stairs. If you do have throw rugs, attach them to the floor with carpet tape.  Make sure that you have a light switch at the top of the stairs and the bottom of  the stairs. If you do not have them, ask someone to add them for you. What else can I do to help prevent falls?  Wear shoes that:  Do not have high heels.  Have rubber bottoms.  Are comfortable and fit you well.  Are closed at the toe. Do not wear sandals.  If you use a stepladder:  Make sure that it is fully opened. Do not climb a closed stepladder.  Make sure that both sides of the stepladder are locked into place.  Ask someone to hold it for you, if possible.  Clearly mark and make sure that you can see:  Any grab bars or handrails.  First and last steps.  Where the edge of each step is.  Use tools that help you move around (mobility aids) if they are needed. These include:  Canes.  Walkers.  Scooters.  Crutches.  Turn on the lights when you go into a dark area. Replace any light bulbs as soon as they burn out.  Set up your furniture so you have a clear path. Avoid moving your furniture around.  If any of your floors are uneven, fix them.  If there are any pets around you, be aware of where they are.  Review your medicines with your doctor. Some medicines can make you feel dizzy. This can increase your chance of falling. Ask your doctor what other things that you can do to help prevent falls. This information is not intended to replace advice given to you by your health care provider. Make sure you discuss any questions you have with your health care provider. Document Released: 02/03/2009 Document Revised: 09/15/2015 Document Reviewed: 05/14/2014 Elsevier Interactive Patient Education  2017 [employer].

## 2020-03-21 NOTE — Progress Notes (Signed)
Subjective:   MERY GUADALUPE is a 69 y.o. female who presents for Medicare Annual (Subsequent) preventive examination.  Review of Systems     Cardiac Risk Factors include: advanced age (>73men, >26 women);dyslipidemia;hypertension;smoking/ tobacco exposure     Objective:    Today's Vitals   03/21/20 0846  BP: 108/62  Pulse: 71  Resp: 16  Temp: 98.3 F (36.8 C)  TempSrc: Oral  SpO2: 99%  Weight: 130 lb 6.4 oz (59.1 kg)  Height: 5\' 4"  (1.626 m)   Body mass index is 22.38 kg/m.  Advanced Directives 03/21/2020 03/16/2019 03/10/2018 02/27/2018 06/12/2017 02/28/2016 02/23/2015  Does Patient Have a Medical Advance Directive? Yes No Yes No Yes No No  Type of Advance Directive Living will - [place];Living will - Living will - -  Does patient want to make changes to medical advance directive? - - - - No - Patient declined - -  Copy of [place] in Chart? - - No - copy requested - - - -  Would patient like information on creating a medical advance directive? - Yes (MAU/Ambulatory/Procedural Areas - Information given) - - - - Yes - Educational materials given    Current Medications (verified) Outpatient Encounter Medications as of 03/21/2020  Medication Sig   amLODipine (NORVASC) 5 MG tablet TAKE 1 TABLET(5 MG) BY MOUTH DAILY   aspirin 81 MG tablet Take 81 mg by mouth daily.   atorvastatin (LIPITOR) 20 MG tablet TAKE 1 TABLET(20 MG) BY MOUTH DAILY   cholecalciferol (VITAMIN D3) 25 MCG (1000 UT) tablet Take 1,000 Units by mouth daily.   lisinopril (ZESTRIL) 10 MG tablet TAKE 1 TABLET(10 MG) BY MOUTH DAILY   Multiple Vitamins-Minerals (MULTIVITAMIN WITH MINERALS) tablet Take 1 tablet daily by mouth.   omeprazole (PRILOSEC) 40 MG capsule TAKE 1 CAPSULE(40 MG) BY MOUTH DAILY   No facility-administered encounter medications on file as of 03/21/2020.    Allergies (verified) Tetracycline   History: Past Medical History:  Diagnosis  Date   GERD (gastroesophageal reflux disease)    Hyperlipidemia    Hypertension    Peripheral vascular disease ([place])    right leg   Past Surgical History:  Procedure Laterality Date   COLONOSCOPY  08/05/2006   normal   COLONOSCOPY WITH PROPOFOL N/A 06/12/2017   Procedure: COLONOSCOPY WITH PROPOFOL;  Surgeon: Lin Landsman, MD;  Location: [place];  Service: Endoscopy;  Laterality: N/A;   TUBAL LIGATION Bilateral    Family History  Problem Relation Age of Onset   Hypertension Mother    Stroke Mother    Breast cancer Neg Hx    Social History   Socioeconomic History   Marital status: Married    Spouse name: Not on file   Number of children: 0   Years of education: Not on file   Highest education level: Some college, no degree  Occupational History   Occupation: retired  Tobacco Use   Smoking status: Current Every Day Smoker    Packs/day: 1.00    Years: 52.00    Pack years: 52.00    Types: Cigarettes    Last attempt to quit: 02/22/2019    Years since quitting: 1.0   Smokeless tobacco: Never Used   Tobacco comment: since age 31; quit 02/2019 after dental work; started smoking again 11/2019  Vaping Use   Vaping Use: Never used  Substance and Sexual Activity   Alcohol use: Yes    Alcohol/week: 4.0 standard drinks  Types: 4 Shots of liquor per week   Drug use: No   Sexual activity: Yes  Other Topics Concern   Not on file  Social History Narrative   Not on file   Social Determinants of Health   Financial Resource Strain: Low Risk    Difficulty of Paying Living Expenses: Not hard at all  Food Insecurity: No Food Insecurity   Worried About Charity fundraiser in the Last Year: Never true   [place] in the Last Year: Never true  Transportation Needs: No Transportation Needs   Lack of Transportation (Medical): No   Lack of Transportation (Non-Medical): No  Physical Activity: Insufficiently Active   Days of  Exercise per Week: 7 days   Minutes of Exercise per Session: 20 min  Stress: No Stress Concern Present   Feeling of Stress : Only a little  Social Connections: Moderately Integrated   Frequency of Communication with Friends and Family: More than three times a week   Frequency of Social Gatherings with Friends and Family: More than three times a week   Attends Religious Services: More than 4 times per year   Active Member of [employer] or Organizations: No   Attends Archivist Meetings: Never   Marital Status: Married    Tobacco Counseling Ready to quit: Not Answered Counseling given: Not Answered Comment: since age 9; quit 02/2019 after dental work; started smoking again 11/2019   Clinical Intake:  Pre-visit preparation completed: Yes  Pain : No/denies pain     BMI - recorded: 22.38 Nutritional Status: BMI of 19-24  Normal Nutritional Risks: None Diabetes: No  How often do you need to have someone help you when you read instructions, pamphlets, or other written materials from your doctor or pharmacy?: 1 - Never  Interpreter Needed?: No  Information entered by :: Clemetine Marker LPN   Activities of Daily Living In your present state of health, do you have any difficulty performing the following activities: 03/21/2020  Hearing? N  Comment declines hearing aids  Vision? N  Difficulty concentrating or making decisions? N  Walking or climbing stairs? N  Dressing or bathing? N  Doing errands, shopping? N  Preparing Food and eating ? N  Using the Toilet? N  In the past six months, have you accidently leaked urine? N  Do you have problems with loss of bowel control? N  Managing your Medications? N  Managing your Finances? N  Housekeeping or managing your Housekeeping? N  Some recent data might be hidden    Patient Care Team: Glean Hess, MD as PCP - General (Internal Medicine)  Indicate any recent Medical Services you may have received from other  than [facility] providers in the past year (date may be approximate).     Assessment:   This is a routine wellness examination for [place].  Hearing/Vision screen  Hearing Screening   125Hz  250Hz  500Hz  1000Hz  2000Hz  3000Hz  4000Hz  6000Hz  8000Hz   Right ear:           Left ear:           Comments: Pt denies hearing difficulty  Vision Screening Comments: Annual vision screenings [facility]  Dietary issues and exercise activities discussed: Current Exercise Habits: Home exercise routine, Type of exercise: walking, Time (Minutes): 20, Frequency (Times/Week): 7, Weekly Exercise (Minutes/Week): 140, Intensity: Mild, Exercise limited by: None identified  Goals     Quit Smoking     Pt would like to quit  smoking. Smoking cessation materials given and information provided about classes and assistance.       Depression Screen PHQ 2/9 Scores 03/21/2020 09/22/2019 03/27/2019 03/16/2019 09/25/2018 03/10/2018 02/27/2018  PHQ - 2 Score 3 0 2 1 0 0 0  PHQ- 9 Score 6 2 8 4  - 1 0    Fall Risk Fall Risk  03/21/2020 09/22/2019 03/16/2019 09/25/2018 02/27/2018  Falls in the past year? 0 0 0 0 0  Number falls in past yr: 0 0 0 0 -  Injury with Fall? 0 0 0 0 -  Risk for fall due to : No Fall Risks No Fall Risks - - -  Follow up Falls prevention discussed Falls evaluation completed Falls prevention discussed Falls evaluation completed -    Any stairs in or around the home? Yes  If so, are there any without handrails? No  Home free of loose throw rugs in walkways, pet beds, electrical cords, etc? Yes  Adequate lighting in your home to reduce risk of falls? Yes   ASSISTIVE DEVICES UTILIZED TO PREVENT FALLS:  Life alert? No  Use of a cane, walker or w/c? No  Grab bars in the bathroom? No  Shower chair or bench in shower? Yes  Elevated toilet seat or a handicapped toilet? Yes   TIMED UP AND GO:  Was the test performed? Yes .  Length of time to ambulate 10 feet: 5 sec.   Gait steady and fast  without use of assistive device  Cognitive Function:     6CIT Screen 03/21/2020 03/10/2018 02/27/2018  What Year? 0 points 0 points 0 points  What month? 0 points 0 points 0 points  What time? 0 points 0 points 0 points  Count back from 20 0 points 0 points 0 points  Months in reverse 0 points 0 points 0 points  Repeat phrase 0 points 0 points 0 points  Total Score 0 0 0    Immunizations Immunization History  Administered Date(s) Administered   Moderna SARS-COVID-2 Vaccination 05/29/2019, 06/26/2019, 02/26/2020   Pneumococcal Conjugate-13 02/28/2016   Pneumococcal Polysaccharide-23 09/24/2012, 02/27/2018   Zoster 12/08/2013   Zoster Recombinat (Shingrix) 03/27/2019, 05/28/2019    TDAP status: Due, Education has been provided regarding the importance of this vaccine. Advised may receive this vaccine at local pharmacy or Health Dept. Aware to provide a copy of the vaccination record if obtained from local pharmacy or Health Dept. Verbalized acceptance and understanding.   Flu Vaccine status: Declined, Education has been provided regarding the importance of this vaccine but patient still declined. Advised may receive this vaccine at local pharmacy or Health Dept. Aware to provide a copy of the vaccination record if obtained from local pharmacy or Health Dept. Verbalized acceptance and understanding.   Pneumococcal vaccine status: Completed during today's visit.   Covid-19 vaccine status: Completed vaccines  Qualifies for Shingles Vaccine? Yes   Zostavax completed Yes   Shingrix Completed?: Yes  Screening Tests Health Maintenance  Topic Date Due   INFLUENZA VACCINE  Never done   TETANUS/TDAP  04/22/2020 (Originally 06/25/1969)   MAMMOGRAM  03/25/2020   COLONOSCOPY  06/13/2027   DEXA SCAN  Completed   COVID-19 Vaccine  Completed   Hepatitis C Screening  Completed   PNA vac Low Risk Adult  Completed    Health Maintenance  Health Maintenance Due  Topic Date Due    INFLUENZA VACCINE  Never done    Colorectal cancer screening: Completed 06/12/17. Repeat every 10 years   Mammogram  status: Completed 03/26/19. Repeat every year. Scheduled for 03/28/20.  Bone Density status: Completed 07/09/06. Results reflect: Bone density results: NORMAL. Repeat every 5 years. pt declined repeat screening.   Lung Cancer Screening: (Low Dose CT Chest recommended if Age 85-80 years, 30 pack-year currently smoking OR have quit w/in 15years.) does qualify. Completed 03/30/19; pt will be contacted by referral coordinator.   Additional Screening:  Hepatitis C Screening: does qualify; Completed 02/28/16  Vision Screening: Recommended annual ophthalmology exams for early detection of glaucoma and other disorders of the eye. Is the patient up to date with their annual eye exam?  Yes  Who is the provider or what is the name of the office in which the patient attends annual eye exams? Dr. Mallie Mussel   Dental Screening: Recommended annual dental exams for proper oral hygiene  Community Resource Referral / Chronic Care Management: CRR required this visit?  No   CCM required this visit?  No      Plan:     I have personally reviewed and noted the following in the patients chart:    Medical and social history  Use of alcohol, tobacco or illicit drugs   Current medications and supplements  Functional ability and status  Nutritional status  Physical activity  Advanced directives  List of other physicians  Hospitalizations, surgeries, and ER visits in previous 12 months  Vitals  Screenings to include cognitive, depression, and falls  Referrals and appointments  In addition, I have reviewed and discussed with patient certain preventive protocols, quality metrics, and best practice recommendations. A written personalized care plan for preventive services as well as general preventive health recommendations were provided to patient.     Clemetine Marker,  LPN   [id number]   Nurse Notes: none

## 2020-03-24 ENCOUNTER — Other Ambulatory Visit: Payer: Self-pay | Admitting: *Deleted

## 2020-03-24 DIAGNOSIS — Z87891 Personal history of nicotine dependence: Secondary | ICD-10-CM

## 2020-03-24 DIAGNOSIS — Z122 Encounter for screening for malignant neoplasm of respiratory organs: Secondary | ICD-10-CM

## 2020-03-24 NOTE — Progress Notes (Signed)
Contacted and scheduled for lung screening scan. Patient is a current smoker with a 54 pack year history

## 2020-03-28 ENCOUNTER — Ambulatory Visit
Admission: RE | Admit: 2020-03-28 | Discharge: 2020-03-28 | Disposition: A | Discharge status: Home / Self Care | Payer: Medicare HMO | Source: Ambulatory Visit | Attending: Internal Medicine | Admitting: Internal Medicine

## 2020-03-28 ENCOUNTER — Other Ambulatory Visit: Payer: Self-pay

## 2020-03-28 DIAGNOSIS — Z1231 Encounter for screening mammogram for malignant neoplasm of breast: Secondary | ICD-10-CM

## 2020-03-29 ENCOUNTER — Ambulatory Visit (INDEPENDENT_AMBULATORY_CARE_PROVIDER_SITE_OTHER): Payer: Medicare HMO | Admitting: Internal Medicine

## 2020-03-29 ENCOUNTER — Encounter: Payer: Self-pay | Admitting: Internal Medicine

## 2020-03-29 VITALS — BP 139/64 | HR 66 | Temp 98.3°F | Ht 64.0 in | Wt 129.0 lb

## 2020-03-29 DIAGNOSIS — F17200 Nicotine dependence, unspecified, uncomplicated: Secondary | ICD-10-CM | POA: Diagnosis not present

## 2020-03-29 DIAGNOSIS — I700 Atherosclerosis of aorta: Secondary | ICD-10-CM

## 2020-03-29 DIAGNOSIS — Z0000 Encounter for general adult medical examination without abnormal findings: Secondary | ICD-10-CM | POA: Diagnosis not present

## 2020-03-29 DIAGNOSIS — I10 Essential (primary) hypertension: Secondary | ICD-10-CM | POA: Diagnosis not present

## 2020-03-29 DIAGNOSIS — K219 Gastro-esophageal reflux disease without esophagitis: Secondary | ICD-10-CM | POA: Diagnosis not present

## 2020-03-29 LAB — POCT URINALYSIS DIPSTICK
Bilirubin, UA: NEGATIVE
Blood, UA: NEGATIVE
Glucose, UA: NEGATIVE
Ketones, UA: NEGATIVE
Leukocytes, UA: NEGATIVE
Nitrite, UA: NEGATIVE
Protein, UA: POSITIVE — AB
Spec Grav, UA: 1.01 (ref 1.010–1.025)
Urobilinogen, UA: 0.2 E.U./dL
pH, UA: 6.5 (ref 5.0–8.0)

## 2020-03-29 NOTE — Progress Notes (Signed)
Date:  03/29/2020   Name:  Angela Herrera   DOB:  [birth year]   MRN:  [id number]   Chief Complaint: Annual Exam (No Breast Exam- mammogream yesterday, No pap- discontinued. Just Had AWV with Kasey 1 week ago.)  Angela Herrera is a 69 y.o. female who presents today for her Complete Annual Exam. She feels well. She reports exercising - everyday. She reports she is sleeping fairly well. Breast complaints - none. Mammogram yesterday. Declined breast exam today.  Mammogram: 03/28/20 DEXA: 06/2006 Colonoscopy: 05/2017 repeat in 10 yrs  Immunization History  Administered Date(s) Administered  . Moderna SARS-COVID-2 Vaccination 05/29/2019, 06/26/2019, 02/26/2020  . Pneumococcal Conjugate-13 02/28/2016  . Pneumococcal Polysaccharide-23 09/24/2012, 02/27/2018  . Zoster 12/08/2013  . Zoster Recombinat (Shingrix) 03/27/2019, 05/28/2019    Hypertension This is a chronic problem. The problem is controlled. Pertinent negatives include no chest pain, headaches, palpitations or shortness of breath. Past treatments include ACE inhibitors and calcium channel blockers. The current treatment provides significant improvement. There are no compliance problems.  Hypertensive end-organ damage includes PVD. There is no history of kidney disease, CAD/MI or CVA.  Gastroesophageal Reflux She complains of heartburn. She reports no abdominal pain, no chest pain, no coughing or no wheezing. This is a recurrent problem. The problem occurs occasionally. Pertinent negatives include no fatigue. She has tried a PPI for the symptoms.  Hyperlipidemia This is a chronic problem. The problem is controlled. Pertinent negatives include no chest pain or shortness of breath. Current antihyperlipidemic treatment includes statins. The current treatment provides significant improvement of lipids.  Tobacco use - quit one year ago for a few months then resumed smoking; no SOB or chronic cough.  Participating in the yearly lung cancer  screening CTs.  Next one scheduled for next week.  Lab Results  Component Value Date   CREATININE 0.70 03/27/2019   BUN 9 03/27/2019   NA 142 03/27/2019   K 4.6 03/27/2019   CL 104 03/27/2019   CO2 24 03/27/2019   Lab Results  Component Value Date   CHOL 171 03/27/2019   HDL 47 03/27/2019   LDLCALC 102 (H) 03/27/2019   TRIG 122 03/27/2019   CHOLHDL 3.6 03/27/2019   Lab Results  Component Value Date   TSH 1.680 03/27/2019   No results found for: HGBA1C Lab Results  Component Value Date   WBC 6.5 03/27/2019   HGB 13.8 03/27/2019   HCT 42.0 03/27/2019   MCV 87 03/27/2019   PLT 438 03/27/2019   Lab Results  Component Value Date   ALT 12 03/27/2019   AST 15 03/27/2019   ALKPHOS 92 03/27/2019   BILITOT 0.5 03/27/2019     Review of Systems  Constitutional: Negative for chills, fatigue and fever.  HENT: Negative for congestion, hearing loss, tinnitus, trouble swallowing and voice change.   Eyes: Negative for visual disturbance.  Respiratory: Negative for cough, chest tightness, shortness of breath and wheezing.   Cardiovascular: Negative for chest pain, palpitations and leg swelling.  Gastrointestinal: Positive for heartburn. Negative for abdominal pain, constipation, diarrhea and vomiting.  Endocrine: Negative for polydipsia and polyuria.  Genitourinary: Negative for dysuria, frequency, genital sores, vaginal bleeding and vaginal discharge.  Musculoskeletal: Negative for arthralgias, gait problem and joint swelling.  Skin: Negative for color change and rash.       Fungal nails  Neurological: Negative for dizziness, tremors, light-headedness and headaches.  Hematological: Negative for adenopathy. Does not bruise/bleed easily.  Psychiatric/Behavioral: Negative for dysphoric mood and  sleep disturbance. The patient is not nervous/anxious.     Patient Active Problem List   Diagnosis Date Noted  . Tobacco use disorder, moderate, in sustained remission 03/29/2020  .  Appetite impaired 09/22/2019  . Aortic atherosclerosis ([place]) 09/25/2018  . Hemangioma of liver 03/24/2018  . GERD without esophagitis 09/14/2015  . Peripheral vascular disease ([place]) 02/23/2015  . Mixed hyperlipidemia 10/22/2014  . Environmental and seasonal allergies 10/22/2014  . Charcot's syndrome 10/22/2014  . Essential hypertension 10/22/2014    Allergies  Allergen Reactions  . Tetracycline Itching    Past Surgical History:  Procedure Laterality Date  . COLONOSCOPY  08/05/2006   normal  . COLONOSCOPY WITH PROPOFOL N/A 06/12/2017   Procedure: COLONOSCOPY WITH PROPOFOL;  Surgeon: Lin Landsman, MD;  Location: [place];  Service: Endoscopy;  Laterality: N/A;  . TUBAL LIGATION Bilateral     Social History   Tobacco Use  . Smoking status: Current Every Day Smoker    Packs/day: 1.00    Years: 52.00    Pack years: 52.00    Types: Cigarettes  . Smokeless tobacco: Never Used  . Tobacco comment: since age 63; quit 02/2019 after dental work; started smoking again 11/2019  Vaping Use  . Vaping Use: Never used  Substance Use Topics  . Alcohol use: Yes    Alcohol/week: 4.0 standard drinks    Types: 4 Shots of liquor per week  . Drug use: No     Medication list has been reviewed and updated.  Current Meds  Medication Sig  . amLODipine (NORVASC) 5 MG tablet TAKE 1 TABLET(5 MG) BY MOUTH DAILY  . aspirin 81 MG tablet Take 81 mg by mouth daily.  Marland Kitchen atorvastatin (LIPITOR) 20 MG tablet TAKE 1 TABLET(20 MG) BY MOUTH DAILY  . cholecalciferol (VITAMIN D3) 25 MCG (1000 UT) tablet Take 1,000 Units by mouth daily.  Marland Kitchen lisinopril (ZESTRIL) 10 MG tablet TAKE 1 TABLET(10 MG) BY MOUTH DAILY  . Multiple Vitamins-Minerals (MULTIVITAMIN WITH MINERALS) tablet Take 1 tablet daily by mouth.  Marland Kitchen omeprazole (PRILOSEC) 40 MG capsule TAKE 1 CAPSULE(40 MG) BY MOUTH DAILY    PHQ 2/9 Scores 03/29/2020 03/21/2020 09/22/2019 03/27/2019  PHQ - 2 Score 3 3 0 2  PHQ- 9 Score 6 6 2 8     GAD  7 : Generalized Anxiety Score 03/29/2020 09/22/2019  Nervous, Anxious, on Edge 0 0  Control/stop worrying 0 0  Worry too much - different things 0 0  Trouble relaxing 0 0  Restless 0 0  Easily annoyed or irritable 0 0  Afraid - awful might happen 0 0  Total GAD 7 Score 0 0  Anxiety Difficulty Not difficult at all Not difficult at all    BP Readings from Last 3 Encounters:  03/29/20 139/64  03/21/20 108/62  09/22/19 112/70    Physical Exam Vitals and nursing note reviewed.  Constitutional:      General: She is not in acute distress.    Appearance: She is well-developed.  HENT:     Head: Normocephalic and atraumatic.     Right Ear: Tympanic membrane and ear canal normal.     Left Ear: Tympanic membrane and ear canal normal.     Nose:     Right Sinus: No maxillary sinus tenderness.     Left Sinus: No maxillary sinus tenderness.  Eyes:     General: No scleral icterus.       Right eye: No discharge.  Left eye: No discharge.     Conjunctiva/sclera: Conjunctivae normal.  Neck:     Thyroid: No thyromegaly.     Vascular: No carotid bruit.  Cardiovascular:     Rate and Rhythm: Normal rate and regular rhythm.     Pulses: Normal pulses.     Heart sounds: Normal heart sounds.  Pulmonary:     Effort: Pulmonary effort is normal. No respiratory distress.     Breath sounds: No wheezing.  Abdominal:     General: Bowel sounds are normal.     Palpations: Abdomen is soft.     Tenderness: There is no abdominal tenderness.  Musculoskeletal:     Cervical back: Normal range of motion. No erythema.     Right lower leg: No edema.     Left lower leg: No edema.  Lymphadenopathy:     Cervical: No cervical adenopathy.  Skin:    General: Skin is warm and dry.     Findings: No rash.     Comments: Fungal nails on left foot  Neurological:     Mental Status: She is alert and oriented to person, place, and time.     Cranial Nerves: No cranial nerve deficit.     Sensory: No sensory  deficit.     Deep Tendon Reflexes: Reflexes are normal and symmetric.  Psychiatric:        Attention and Perception: Attention normal.        Mood and Affect: Mood normal.     Wt Readings from Last 3 Encounters:  03/29/20 129 lb (58.5 kg)  03/21/20 130 lb 6.4 oz (59.1 kg)  09/22/19 132 lb (59.9 kg)    BP 139/64   Pulse 66   Temp 98.3 F (36.8 C) (Oral)   Ht 5\' 4"  (1.626 m)   Wt 129 lb (58.5 kg)   SpO2 99%   BMI 22.14 kg/m   Assessment and Plan: 1. Annual physical exam Mammogram done yesterday Immunizations up to date  Pt declines flu vaccine - POCT urinalysis dipstick  2. Essential hypertension Clinically stable exam with well controlled BP on lisinopril and amlodipine. Tolerating medications without side effects at this time. Pt to continue current regimen and low sodium diet; benefits of regular exercise as able discussed. - CBC with Differential/Platelet - Comprehensive metabolic panel - TSH  3. Aortic atherosclerosis (HCC) On statin therapy and aspirin - Lipid panel  4. GERD without esophagitis Symptoms well controlled on daily PPI No red flag signs such as weight loss, n/v, melena Will continue omeprazole. - CBC with Differential/Platelet  5. Tobacco use disorder Pt has resumed smoking - discussed working to cut back as able Continue annual CT screening   Partially dictated using Editor, commissioning. Any errors are unintentional.  Halina Maidens, MD [facility]  03/29/2020

## 2020-03-30 LAB — CBC WITH DIFFERENTIAL/PLATELET
Basophils Absolute: 0 10*3/uL (ref 0.0–0.2)
Basos: 1 %
EOS (ABSOLUTE): 0.2 10*3/uL (ref 0.0–0.4)
Eos: 4 %
Hematocrit: 41.5 % (ref 34.0–46.6)
Hemoglobin: 13.7 g/dL (ref 11.1–15.9)
Immature Grans (Abs): 0 10*3/uL (ref 0.0–0.1)
Immature Granulocytes: 0 %
Lymphocytes Absolute: 2.5 10*3/uL (ref 0.7–3.1)
Lymphs: 42 %
MCH: 29 pg (ref 26.6–33.0)
MCHC: 33 g/dL (ref 31.5–35.7)
MCV: 88 fL (ref 79–97)
Monocytes Absolute: 0.8 10*3/uL (ref 0.1–0.9)
Monocytes: 13 %
Neutrophils Absolute: 2.4 10*3/uL (ref 1.4–7.0)
Neutrophils: 40 %
Platelets: 426 10*3/uL (ref 150–450)
RBC: 4.73 x10E6/uL (ref 3.77–5.28)
RDW: 14 % (ref 11.7–15.4)
WBC: 5.9 10*3/uL (ref 3.4–10.8)

## 2020-03-30 LAB — COMPREHENSIVE METABOLIC PANEL
ALT: 11 IU/L (ref 0–32)
AST: 22 IU/L (ref 0–40)
Albumin/Globulin Ratio: 1.5 (ref 1.2–2.2)
Albumin: 4.2 g/dL (ref 3.8–4.8)
Alkaline Phosphatase: 95 IU/L (ref 44–121)
BUN/Creatinine Ratio: 18 (ref 12–28)
BUN: 12 mg/dL (ref 8–27)
Bilirubin Total: 0.6 mg/dL (ref 0.0–1.2)
CO2: 25 mmol/L (ref 20–29)
Calcium: 9.6 mg/dL (ref 8.7–10.3)
Chloride: 100 mmol/L (ref 96–106)
Creatinine, Ser: 0.68 mg/dL (ref 0.57–1.00)
GFR calc Af Amer: 102 mL/min/{1.73_m2} (ref >59)
GFR calc non Af Amer: 89 mL/min/{1.73_m2} (ref >59)
Globulin, Total: 2.8 g/dL (ref 1.5–4.5)
Glucose: 86 mg/dL (ref 65–99)
Potassium: 4.2 mmol/L (ref 3.5–5.2)
Sodium: 141 mmol/L (ref 134–144)
Total Protein: 7 g/dL (ref 6.0–8.5)

## 2020-03-30 LAB — LIPID PANEL
Chol/HDL Ratio: 3.4 ratio (ref 0.0–4.4)
Cholesterol, Total: 225 mg/dL — ABNORMAL HIGH (ref 100–199)
HDL: 67 mg/dL (ref >39)
LDL Chol Calc (NIH): 135 mg/dL — ABNORMAL HIGH (ref 0–99)
Triglycerides: 129 mg/dL (ref 0–149)
VLDL Cholesterol Cal: 23 mg/dL (ref 5–40)

## 2020-03-30 LAB — TSH: TSH: 2.65 u[IU]/mL (ref 0.450–4.500)

## 2020-04-06 ENCOUNTER — Other Ambulatory Visit: Payer: Self-pay

## 2020-04-06 ENCOUNTER — Ambulatory Visit
Admission: RE | Admit: 2020-04-06 | Discharge: 2020-04-06 | Disposition: A | Discharge status: Home / Self Care | Payer: Medicare HMO | Source: Ambulatory Visit | Attending: Oncology | Admitting: Oncology

## 2020-04-06 DIAGNOSIS — Z87891 Personal history of nicotine dependence: Secondary | ICD-10-CM | POA: Insufficient documentation

## 2020-04-06 DIAGNOSIS — Z122 Encounter for screening for malignant neoplasm of respiratory organs: Secondary | ICD-10-CM | POA: Diagnosis present

## 2020-04-08 ENCOUNTER — Encounter: Payer: Self-pay | Admitting: *Deleted

## 2020-04-28 ENCOUNTER — Other Ambulatory Visit: Payer: Self-pay | Admitting: Internal Medicine

## 2020-04-28 DIAGNOSIS — E7849 Other hyperlipidemia: Secondary | ICD-10-CM

## 2020-05-20 ENCOUNTER — Other Ambulatory Visit: Payer: Self-pay | Admitting: Internal Medicine

## 2020-05-20 DIAGNOSIS — I10 Essential (primary) hypertension: Secondary | ICD-10-CM

## 2020-06-14 ENCOUNTER — Other Ambulatory Visit: Payer: Self-pay | Admitting: Internal Medicine

## 2020-06-14 DIAGNOSIS — K219 Gastro-esophageal reflux disease without esophagitis: Secondary | ICD-10-CM

## 2020-06-16 ENCOUNTER — Other Ambulatory Visit: Payer: Self-pay | Admitting: Internal Medicine

## 2020-06-16 DIAGNOSIS — I10 Essential (primary) hypertension: Secondary | ICD-10-CM

## 2020-08-15 ENCOUNTER — Other Ambulatory Visit: Payer: Self-pay | Admitting: Internal Medicine

## 2020-08-15 DIAGNOSIS — I10 Essential (primary) hypertension: Secondary | ICD-10-CM

## 2020-09-05 ENCOUNTER — Ambulatory Visit: Payer: Medicare HMO | Admitting: Internal Medicine

## 2020-09-05 ENCOUNTER — Other Ambulatory Visit: Payer: Self-pay

## 2020-09-05 ENCOUNTER — Encounter: Payer: Self-pay | Admitting: Internal Medicine

## 2020-09-05 VITALS — BP 110/72 | HR 67 | Ht 64.0 in | Wt 126.0 lb

## 2020-09-05 DIAGNOSIS — I700 Atherosclerosis of aorta: Secondary | ICD-10-CM

## 2020-09-05 DIAGNOSIS — I10 Essential (primary) hypertension: Secondary | ICD-10-CM

## 2020-09-05 DIAGNOSIS — E782 Mixed hyperlipidemia: Secondary | ICD-10-CM | POA: Diagnosis not present

## 2020-09-05 MED ORDER — AMLODIPINE BESYLATE 5 MG PO TABS: 5.0000 mg | ORAL_TABLET | Freq: Every day | ORAL | 1 refills | Status: DC

## 2020-09-05 NOTE — Progress Notes (Signed)
Date:  09/05/2020   Name:  Angela Herrera   DOB:  05/12/1950   MRN:  [id number]   Chief Complaint: Hyperlipidemia and Hypertension  Hypertension This is a chronic problem. The problem is controlled. Pertinent negatives include no chest pain, headaches, palpitations or shortness of breath. Past treatments include calcium channel blockers and ACE inhibitors. The current treatment provides significant improvement. Hypertensive end-organ damage includes PVD.  Hyperlipidemia This is a chronic problem. The problem is resistant. Pertinent negatives include no chest pain, myalgias or shortness of breath. Current antihyperlipidemic treatment includes statins. The current treatment provides moderate improvement of lipids.    Lab Results  Component Value Date   CREATININE 0.68 03/29/2020   BUN 12 03/29/2020   NA 141 03/29/2020   K 4.2 03/29/2020   CL 100 03/29/2020   CO2 25 03/29/2020   Lab Results  Component Value Date   CHOL 225 (H) 03/29/2020   HDL 67 03/29/2020   LDLCALC 135 (H) 03/29/2020   TRIG 129 03/29/2020   CHOLHDL 3.4 03/29/2020   Lab Results  Component Value Date   TSH 2.650 03/29/2020   No results found for: HGBA1C Lab Results  Component Value Date   WBC 5.9 03/29/2020   HGB 13.7 03/29/2020   HCT 41.5 03/29/2020   MCV 88 03/29/2020   PLT 426 03/29/2020   Lab Results  Component Value Date   ALT 11 03/29/2020   AST 22 03/29/2020   ALKPHOS 95 03/29/2020   BILITOT 0.6 03/29/2020     Review of Systems  Constitutional: Positive for appetite change. Negative for fatigue and unexpected weight change.  HENT: Negative for nosebleeds.   Eyes: Negative for visual disturbance.  Respiratory: Negative for cough, chest tightness, shortness of breath and wheezing.   Cardiovascular: Negative for chest pain, palpitations and leg swelling.  Gastrointestinal: Negative for abdominal pain, constipation and diarrhea.  Musculoskeletal: Negative for arthralgias and myalgias.   Neurological: Negative for dizziness, weakness, light-headedness and headaches.    Patient Active Problem List   Diagnosis Date Noted  . Aortic atherosclerosis ([place]) 09/25/2018  . Hemangioma of liver 03/24/2018  . GERD without esophagitis 09/14/2015  . Peripheral vascular disease ([place]) 02/23/2015  . Tobacco use disorder 02/23/2015  . Mixed hyperlipidemia 10/22/2014  . Environmental and seasonal allergies 10/22/2014  . Essential hypertension 10/22/2014    Allergies  Allergen Reactions  . Tetracycline Itching    Past Surgical History:  Procedure Laterality Date  . COLONOSCOPY  08/05/2006   normal  . COLONOSCOPY WITH PROPOFOL N/A 06/12/2017   Procedure: COLONOSCOPY WITH PROPOFOL;  Surgeon: Lin Landsman, MD;  Location: [place];  Service: Endoscopy;  Laterality: N/A;  . TUBAL LIGATION Bilateral     Social History   Tobacco Use  . Smoking status: Current Every Day Smoker    Packs/day: 1.00    Years: 52.00    Pack years: 52.00    Types: Cigarettes    Start date: 52  . Smokeless tobacco: Never Used  . Tobacco comment: since age 89; quit 02/2019 after dental work; started smoking again 11/2019  Vaping Use  . Vaping Use: Never used  Substance Use Topics  . Alcohol use: Yes    Alcohol/week: 4.0 standard drinks    Types: 4 Shots of liquor per week  . Drug use: No     Medication list has been reviewed and updated.  Current Meds  Medication Sig  . amLODipine (NORVASC) 5 MG tablet TAKE 1 TABLET(5 MG)  BY MOUTH DAILY  . aspirin 81 MG tablet Take 81 mg by mouth daily.  Marland Kitchen atorvastatin (LIPITOR) 20 MG tablet TAKE 1 TABLET(20 MG) BY MOUTH DAILY  . cholecalciferol (VITAMIN D3) 25 MCG (1000 UT) tablet Take 1,000 Units by mouth daily.  Marland Kitchen lisinopril (ZESTRIL) 10 MG tablet TAKE 1 TABLET(10 MG) BY MOUTH DAILY  . Multiple Vitamins-Minerals (MULTIVITAMIN WITH MINERALS) tablet Take 1 tablet daily by mouth.  Marland Kitchen omeprazole (PRILOSEC) 40 MG capsule TAKE 1 CAPSULE(40  MG) BY MOUTH DAILY    PHQ 2/9 Scores 09/05/2020 03/29/2020 03/21/2020 09/22/2019  PHQ - 2 Score 3 3 3  0  PHQ- 9 Score 9 6 6 2     GAD 7 : Generalized Anxiety Score 09/05/2020 03/29/2020 09/22/2019  Nervous, Anxious, on Edge 0 0 0  Control/stop worrying 0 0 0  Worry too much - different things 0 0 0  Trouble relaxing 2 0 0  Restless 0 0 0  Easily annoyed or irritable 0 0 0  Afraid - awful might happen 0 0 0  Total GAD 7 Score 2 0 0  Anxiety Difficulty Not difficult at all Not difficult at all Not difficult at all    BP Readings from Last 3 Encounters:  09/05/20 110/72  03/29/20 139/64  03/21/20 108/62    Physical Exam Vitals and nursing note reviewed.  Constitutional:      General: She is not in acute distress.    Appearance: She is well-developed.  HENT:     Head: Normocephalic and atraumatic.  Neck:     Vascular: No carotid bruit.  Cardiovascular:     Rate and Rhythm: Normal rate and regular rhythm.     Pulses: Normal pulses.     Heart sounds: No murmur heard.   Pulmonary:     Effort: Pulmonary effort is normal. No respiratory distress.     Breath sounds: No wheezing or rhonchi.  Musculoskeletal:     Cervical back: Normal range of motion.     Right lower leg: No edema.     Left lower leg: No edema.  Lymphadenopathy:     Cervical: No cervical adenopathy.  Skin:    General: Skin is warm and dry.     Findings: No rash.  Neurological:     Mental Status: She is alert and oriented to person, place, and time.  Psychiatric:        Mood and Affect: Mood normal.        Behavior: Behavior normal.     Wt Readings from Last 3 Encounters:  09/05/20 126 lb (57.2 kg)  04/06/20 130 lb (59 kg)  03/29/20 129 lb (58.5 kg)    BP 110/72   Pulse 67   Ht 5\' 4"  (1.626 m)   Wt 126 lb (57.2 kg)   SpO2 99%   BMI 21.63 kg/m   Assessment and Plan: 1. Mixed hyperlipidemia Continue Lipitor 20 mg and low fat diet If not improved, will recommend change to Crestor 10 mg - Lipid  panel  2. Essential hypertension Clinically stable exam with well controlled BP. Tolerating medications without side effects at this time. Pt to continue current regimen and low sodium diet; benefits of regular exercise as able discussed. - amLODipine (NORVASC) 5 MG tablet; Take 1 tablet (5 mg total) by mouth daily.  Dispense: 90 tablet; Refill: 1  3. Aortic atherosclerosis ([place]) On statin therapy   Partially dictated using Editor, commissioning. Any errors are unintentional.  Halina Maidens, MD [facility]  09/05/2020

## 2020-09-06 LAB — LIPID PANEL
Chol/HDL Ratio: 3.7 ratio (ref 0.0–4.4)
Cholesterol, Total: 242 mg/dL — ABNORMAL HIGH (ref 100–199)
HDL: 66 mg/dL (ref >39)
LDL Chol Calc (NIH): 146 mg/dL — ABNORMAL HIGH (ref 0–99)
Triglycerides: 170 mg/dL — ABNORMAL HIGH (ref 0–149)
VLDL Cholesterol Cal: 30 mg/dL (ref 5–40)

## 2020-09-07 ENCOUNTER — Other Ambulatory Visit: Payer: Self-pay

## 2020-09-07 MED ORDER — ROSUVASTATIN CALCIUM 10 MG PO TABS: 10.0000 mg | ORAL_TABLET | Freq: Every day | ORAL | 3 refills | Status: DC

## 2020-09-10 ENCOUNTER — Other Ambulatory Visit: Payer: Self-pay | Admitting: Internal Medicine

## 2020-09-10 DIAGNOSIS — K219 Gastro-esophageal reflux disease without esophagitis: Secondary | ICD-10-CM

## 2020-09-10 NOTE — Telephone Encounter (Signed)
Requested Prescriptions  Pending Prescriptions Disp Refills  . omeprazole (PRILOSEC) 40 MG capsule [Pharmacy Med Name: OMEPRAZOLE 40MG  CAPSULES] 90 capsule 1    Sig: TAKE 1 CAPSULE(40 MG) BY MOUTH DAILY     Gastroenterology: Proton Pump Inhibitors Passed - 09/10/2020  3:34 AM      Passed - Valid encounter within last 12 months    Recent Outpatient Visits          5 days ago Mixed hyperlipidemia   [facility] Glean Hess, MD   5 months ago Annual physical exam   [facility] Glean Hess, MD   11 months ago Essential hypertension   [facility] Glean Hess, MD   1 year ago Annual physical exam   [facility] Glean Hess, MD   1 year ago Essential hypertension   [facility] Glean Hess, MD      Future Appointments            In 6 months Army Melia Jesse Sans, MD [facility], [facility]

## 2020-12-22 NOTE — Telephone Encounter (Signed)
complete

## 2021-01-12 ENCOUNTER — Other Ambulatory Visit: Payer: Self-pay | Admitting: Internal Medicine

## 2021-01-12 DIAGNOSIS — I10 Essential (primary) hypertension: Secondary | ICD-10-CM

## 2021-01-12 NOTE — Telephone Encounter (Signed)
Requested medication (s) are due for refill today: Yes  Requested medication (s) are on the active medication list: Yes  Last refill:  08/15/20  Future visit scheduled: Yes  Notes to clinic:  Protocol indicates pt. Needs lab work.    Requested Prescriptions  Pending Prescriptions Disp Refills   lisinopril (ZESTRIL) 10 MG tablet [Pharmacy Med Name: LISINOPRIL 10MG  TABLETS] 90 tablet 0    Sig: TAKE 1 TABLET(10 MG) BY MOUTH DAILY     Cardiovascular:  ACE Inhibitors Failed - 01/12/2021 11:51 AM      Failed - Cr in normal range and within 180 days    Creatinine, Ser  Date Value Ref Range Status  03/29/2020 0.68 0.57 - 1.00 mg/dL Final          Failed - K in normal range and within 180 days    Potassium  Date Value Ref Range Status  03/29/2020 4.2 3.5 - 5.2 mmol/L Final          Passed - Patient is not pregnant      Passed - Last BP in normal range    BP Readings from Last 1 Encounters:  09/05/20 110/72          Passed - Valid encounter within last 6 months    Recent Outpatient Visits           4 months ago Mixed hyperlipidemia   [facility] Glean Hess, MD   9 months ago Annual physical exam   [facility] Glean Hess, MD   1 year ago Essential hypertension   [facility] Glean Hess, MD   1 year ago Annual physical exam   [facility] Glean Hess, MD   2 years ago Essential hypertension   [facility] Glean Hess, MD       Future Appointments             In 2 months Army Melia Jesse Sans, MD [facility], [facility]

## 2021-01-12 NOTE — Telephone Encounter (Signed)
Medication refill

## 2021-03-10 ENCOUNTER — Other Ambulatory Visit: Payer: Self-pay | Admitting: Internal Medicine

## 2021-03-10 DIAGNOSIS — I10 Essential (primary) hypertension: Secondary | ICD-10-CM

## 2021-03-10 NOTE — Telephone Encounter (Signed)
Requested Prescriptions  Pending Prescriptions Disp Refills  . amLODipine (NORVASC) 5 MG tablet [Pharmacy Med Name: AMLODIPINE BESYLATE 5MG  TABLETS] 90 tablet 0    Sig: TAKE 1 TABLET(5 MG) BY MOUTH DAILY     Cardiovascular:  Calcium Channel Blockers Failed - 03/10/2021  2:13 PM      Failed - Valid encounter within last 6 months    Recent Outpatient Visits          6 months ago Mixed hyperlipidemia   [facility] Glean Hess, MD   11 months ago Annual physical exam   [facility] Glean Hess, MD   1 year ago Essential hypertension   [facility] Glean Hess, MD   1 year ago Annual physical exam   [facility] Glean Hess, MD   2 years ago Essential hypertension   [place], [place]      Future Appointments            In 3 weeks Glean Hess, MD [facility], [place] BP in normal range    BP Readings from Last 1 Encounters:  09/05/20 110/72

## 2021-03-20 ENCOUNTER — Other Ambulatory Visit: Payer: Self-pay | Admitting: Internal Medicine

## 2021-03-20 DIAGNOSIS — Z1231 Encounter for screening mammogram for malignant neoplasm of breast: Secondary | ICD-10-CM

## 2021-03-22 ENCOUNTER — Ambulatory Visit (INDEPENDENT_AMBULATORY_CARE_PROVIDER_SITE_OTHER): Payer: Medicare HMO

## 2021-03-22 VITALS — BP 129/87 | HR 79 | Ht 64.0 in | Wt 124.0 lb

## 2021-03-22 DIAGNOSIS — Z122 Encounter for screening for malignant neoplasm of respiratory organs: Secondary | ICD-10-CM | POA: Diagnosis not present

## 2021-03-22 DIAGNOSIS — Z0000 Encounter for general adult medical examination without abnormal findings: Secondary | ICD-10-CM | POA: Diagnosis not present

## 2021-03-22 NOTE — Patient Instructions (Signed)
Angela Herrera , Thank you for taking time to come for your Medicare Wellness Visit. I appreciate your ongoing commitment to your health goals. Please review the following plan we discussed and let me know if I can assist you in the future.   Screening recommendations/referrals: Colonoscopy: done 06/12/17. Repeat 05/2027 Mammogram: done 03/28/20; scheduled for 03/29/21 Bone Density: done 2008 Recommended yearly ophthalmology/optometry visit for glaucoma screening and checkup Recommended yearly dental visit for hygiene and checkup  Vaccinations: Influenza vaccine: declined Pneumococcal vaccine: done 02/27/18 Tdap vaccine: due Shingles vaccine: done 03/27/19 & 05/28/19   Covid-19:done 05/29/19, 06/26/19, 02/26/20 & 01/24/21  Advanced directives: Advance directive discussed with you today. I have provided a copy for you to complete at home and have notarized. Once this is complete please bring a copy in to our office so we can scan it into your chart.   Conditions/risks identified: If you wish to quit smoking, help is available. For free tobacco cessation program offerings call the [facility] at [phone] or Live Well Line at [phone]. You may also visit [email or web address] or email [email or web address] for more information on other programs.   Next appointment: Follow up in one year for your annual wellness visit    Preventive Care 65 Years and Older, Female Preventive care refers to lifestyle choices and visits with your health care provider that can promote health and wellness. What does preventive care include? A yearly physical exam. This is also called an annual well check. Dental exams once or twice a year. Routine eye exams. Ask your health care provider how often you should have your eyes checked. Personal lifestyle choices, including: Daily care of your teeth and gums. Regular physical activity. Eating a healthy diet. Avoiding tobacco and drug use. Limiting  alcohol use. Practicing safe sex. Taking low-dose aspirin every day. Taking vitamin and mineral supplements as recommended by your health care provider. What happens during an annual well check? The services and screenings done by your health care provider during your annual well check will depend on your age, overall health, lifestyle risk factors, and family history of disease. Counseling  Your health care provider may ask you questions about your: Alcohol use. Tobacco use. Drug use. Emotional well-being. Home and relationship well-being. Sexual activity. Eating habits. History of falls. Memory and ability to understand (cognition). Work and work Statistician. Reproductive health. Screening  You may have the following tests or measurements: Height, weight, and BMI. Blood pressure. Lipid and cholesterol levels. These may be checked every 5 years, or more frequently if you are over 2 years old. Skin check. Lung cancer screening. You may have this screening every year starting at age 92 if you have a 30-pack-year history of smoking and currently smoke or have quit within the past 15 years. Fecal occult blood test (FOBT) of the stool. You may have this test every year starting at age 47. Flexible sigmoidoscopy or colonoscopy. You may have a sigmoidoscopy every 5 years or a colonoscopy every 10 years starting at age 35. Hepatitis C blood test. Hepatitis B blood test. Sexually transmitted disease (STD) testing. Diabetes screening. This is done by checking your blood sugar (glucose) after you have not eaten for a while (fasting). You may have this done every 1-3 years. Bone density scan. This is done to screen for osteoporosis. You may have this done starting at age 70. Mammogram. This may be done every 1-2 years. Talk to your health care provider about how often you should have  regular mammograms. Talk with your health care provider about your test results, treatment options, and if  necessary, the need for more tests. Vaccines  Your health care provider may recommend certain vaccines, such as: Influenza vaccine. This is recommended every year. Tetanus, diphtheria, and acellular pertussis (Tdap, Td) vaccine. You may need a Td booster every 10 years. Zoster vaccine. You may need this after age 26. Pneumococcal 13-valent conjugate (PCV13) vaccine. One dose is recommended after age 30. Pneumococcal polysaccharide (PPSV23) vaccine. One dose is recommended after age 49. Talk to your health care provider about which screenings and vaccines you need and how often you need them. This information is not intended to replace advice given to you by your health care provider. Make sure you discuss any questions you have with your health care provider. Document Released: 05/06/2015 Document Revised: 12/28/2015 Document Reviewed: 02/08/2015 Elsevier Interactive Patient Education  2017 [place] Prevention in the Home Falls can cause injuries. They can happen to people of all ages. There are many things you can do to make your home safe and to help prevent falls. What can I do on the outside of my home? Regularly fix the edges of walkways and driveways and fix any cracks. Remove anything that might make you trip as you walk through a door, such as a raised step or threshold. Trim any bushes or trees on the path to your home. Use bright outdoor lighting. Clear any walking paths of anything that might make someone trip, such as rocks or tools. Regularly check to see if handrails are loose or broken. Make sure that both sides of any steps have handrails. Any raised decks and porches should have guardrails on the edges. Have any leaves, snow, or ice cleared regularly. Use sand or salt on walking paths during winter. Clean up any spills in your garage right away. This includes oil or grease spills. What can I do in the bathroom? Use night lights. Install grab bars by the toilet  and in the tub and shower. Do not use towel bars as grab bars. Use non-skid mats or decals in the tub or shower. If you need to sit down in the shower, use a plastic, non-slip stool. Keep the floor dry. Clean up any water that spills on the floor as soon as it happens. Remove soap buildup in the tub or shower regularly. Attach bath mats securely with double-sided non-slip rug tape. Do not have throw rugs and other things on the floor that can make you trip. What can I do in the bedroom? Use night lights. Make sure that you have a light by your bed that is easy to reach. Do not use any sheets or blankets that are too big for your bed. They should not hang down onto the floor. Have a firm chair that has side arms. You can use this for support while you get dressed. Do not have throw rugs and other things on the floor that can make you trip. What can I do in the kitchen? Clean up any spills right away. Avoid walking on wet floors. Keep items that you use a lot in easy-to-reach places. If you need to reach something above you, use a strong step stool that has a grab bar. Keep electrical cords out of the way. Do not use floor polish or wax that makes floors slippery. If you must use wax, use non-skid floor wax. Do not have throw rugs and other things on the floor that  can make you trip. What can I do with my stairs? Do not leave any items on the stairs. Make sure that there are handrails on both sides of the stairs and use them. Fix handrails that are broken or loose. Make sure that handrails are as long as the stairways. Check any carpeting to make sure that it is firmly attached to the stairs. Fix any carpet that is loose or worn. Avoid having throw rugs at the top or bottom of the stairs. If you do have throw rugs, attach them to the floor with carpet tape. Make sure that you have a light switch at the top of the stairs and the bottom of the stairs. If you do not have them, ask someone to add  them for you. What else can I do to help prevent falls? Wear shoes that: Do not have high heels. Have rubber bottoms. Are comfortable and fit you well. Are closed at the toe. Do not wear sandals. If you use a stepladder: Make sure that it is fully opened. Do not climb a closed stepladder. Make sure that both sides of the stepladder are locked into place. Ask someone to hold it for you, if possible. Clearly mark and make sure that you can see: Any grab bars or handrails. First and last steps. Where the edge of each step is. Use tools that help you move around (mobility aids) if they are needed. These include: Canes. Walkers. Scooters. Crutches. Turn on the lights when you go into a dark area. Replace any light bulbs as soon as they burn out. Set up your furniture so you have a clear path. Avoid moving your furniture around. If any of your floors are uneven, fix them. If there are any pets around you, be aware of where they are. Review your medicines with your doctor. Some medicines can make you feel dizzy. This can increase your chance of falling. Ask your doctor what other things that you can do to help prevent falls. This information is not intended to replace advice given to you by your health care provider. Make sure you discuss any questions you have with your health care provider. Document Released: 02/03/2009 Document Revised: 09/15/2015 Document Reviewed: 05/14/2014 Elsevier Interactive Patient Education  2017 [employer].

## 2021-03-22 NOTE — Progress Notes (Signed)
Subjective:   Angela Herrera is a 70 y.o. female who presents for Medicare Annual (Subsequent) preventive examination.  Virtual Visit via Telephone Note  I connected with  [name] on 03/22/21 at  8:40 AM EST by telephone and verified that I am speaking with the correct person using two identifiers.  Location: Patient: home Provider: [facility] Bryan Persons participating in the virtual visit: [place]   I discussed the limitations, risks, security and privacy concerns of performing an evaluation and management service by telephone and the availability of in person appointments. The patient expressed understanding and agreed to proceed.  Interactive audio and video telecommunications were attempted between this nurse and patient, however failed, due to patient having technical difficulties OR patient did not have access to video capability.  We continued and completed visit with audio only.  Some vital signs may be absent or patient reported.   Clemetine Marker, LPN   Review of Systems     Cardiac Risk Factors include: advanced age (>38men, >35 women);dyslipidemia;hypertension;smoking/ tobacco exposure     Objective:    Today's Vitals   03/22/21 0906  BP: 129/87  Pulse: 79  Weight: 124 lb (56.2 kg)  Height: 5\' 4"  (1.626 m)   Body mass index is 21.28 kg/m.  Advanced Directives 03/22/2021 03/21/2020 03/16/2019 03/10/2018 02/27/2018 06/12/2017 02/28/2016  Does Patient Have a Medical Advance Directive? No Yes No Yes No Yes No  Type of Advance Directive - Living will - [place];Living will - Living will -  Does patient want to make changes to medical advance directive? - - - - - No - Patient declined -  Copy of [place] in Chart? - - - No - copy requested - - -  Would patient like information on creating a medical advance directive? Yes (MAU/Ambulatory/Procedural Areas - Information given) - Yes (MAU/Ambulatory/Procedural  Areas - Information given) - - - -    Current Medications (verified) Outpatient Encounter Medications as of 03/22/2021  Medication Sig   amLODipine (NORVASC) 5 MG tablet TAKE 1 TABLET(5 MG) BY MOUTH DAILY   aspirin 81 MG tablet Take 81 mg by mouth daily.   cholecalciferol (VITAMIN D3) 25 MCG (1000 UT) tablet Take 1,000 Units by mouth daily.   lisinopril (ZESTRIL) 10 MG tablet TAKE 1 TABLET(10 MG) BY MOUTH DAILY   Multiple Vitamins-Minerals (MULTIVITAMIN WITH MINERALS) tablet Take 1 tablet daily by mouth.   omeprazole (PRILOSEC) 40 MG capsule TAKE 1 CAPSULE(40 MG) BY MOUTH DAILY   rosuvastatin (CRESTOR) 10 MG tablet Take 1 tablet (10 mg total) by mouth daily.   No facility-administered encounter medications on file as of 03/22/2021.    Allergies (verified) Tetracycline   History: Past Medical History:  Diagnosis Date   Appetite impaired 09/22/2019   GERD (gastroesophageal reflux disease)    Hyperlipidemia    Hypertension    Peripheral vascular disease ([place])    right leg   Tobacco use disorder 02/23/2015   Past Surgical History:  Procedure Laterality Date   COLONOSCOPY  08/05/2006   normal   COLONOSCOPY WITH PROPOFOL N/A 06/12/2017   Procedure: COLONOSCOPY WITH PROPOFOL;  Surgeon: Lin Landsman, MD;  Location: [facility];  Service: Endoscopy;  Laterality: N/A;   TUBAL LIGATION Bilateral    Family History  Problem Relation Age of Onset   Hypertension Mother    Stroke Mother    Breast cancer Neg Hx    Social History   Socioeconomic History  Marital status: Married    Spouse name: Not on file   Number of children: 0   Years of education: Not on file   Highest education level: Some college, no degree  Occupational History   Occupation: retired  Tobacco Use   Smoking status: Every Day    Packs/day: 1.00    Years: 52.00    Pack years: 52.00    Types: Cigarettes    Start date: 1965   Smokeless tobacco: Never   Tobacco comments:    since age 34; quit  02/2019 after dental work; started smoking again 11/2019; smoking .75 PPD 03/22/21  Vaping Use   Vaping Use: Never used  Substance and Sexual Activity   Alcohol use: Yes    Alcohol/week: 4.0 standard drinks    Types: 4 Shots of liquor per week   Drug use: No   Sexual activity: Yes  Other Topics Concern   Not on file  Social History Narrative   Not on file   Social Determinants of Health   Financial Resource Strain: Low Risk    Difficulty of Paying Living Expenses: Not hard at all  Food Insecurity: No Food Insecurity   Worried About Charity fundraiser in the Last Year: Never true   [place] in the Last Year: Never true  Transportation Needs: No Transportation Needs   Lack of Transportation (Medical): No   Lack of Transportation (Non-Medical): No  Physical Activity: Insufficiently Active   Days of Exercise per Week: 7 days   Minutes of Exercise per Session: 20 min  Stress: No Stress Concern Present   Feeling of Stress : Not at all  Social Connections: Moderately Integrated   Frequency of Communication with Friends and Family: More than three times a week   Frequency of Social Gatherings with Friends and Family: More than three times a week   Attends Religious Services: More than 4 times per year   Active Member of [employer] or Organizations: No   Attends Archivist Meetings: Never   Marital Status: Married    Tobacco Counseling Ready to quit: Not Answered Counseling given: Not Answered Tobacco comments: since age 60; quit 02/2019 after dental work; started smoking again 11/2019; smoking .75 PPD 03/22/21   Clinical Intake:  Pre-visit preparation completed: Yes  Pain : No/denies pain     Nutritional Risks: None Diabetes: No  How often do you need to have someone help you when you read instructions, pamphlets, or other written materials from your doctor or pharmacy?: 1 - Never    Interpreter Needed?: No  Information entered by :: Clemetine Marker  LPN   Activities of Daily Living In your present state of health, do you have any difficulty performing the following activities: 03/22/2021 09/05/2020  Hearing? N N  Vision? N N  Difficulty concentrating or making decisions? N N  Walking or climbing stairs? N N  Dressing or bathing? N N  Doing errands, shopping? N N  Preparing Food and eating ? N -  Using the Toilet? N -  In the past six months, have you accidently leaked urine? N -  Do you have problems with loss of bowel control? N -  Managing your Medications? N -  Managing your Finances? N -  Housekeeping or managing your Housekeeping? N -  Some recent data might be hidden    Patient Care Team: Glean Hess, MD as PCP - General (Internal Medicine)  Indicate any recent Medical Services you may  have received from other than [facility] providers in the past year (date may be approximate).     Assessment:   This is a routine wellness examination for Angela Herrera.  Hearing/Vision screen Hearing Screening - Comments:: Pt denies hearing difficulty Vision Screening - Comments:: Annual vision screenings Dr[facility] at [facility]  Dietary issues and exercise activities discussed: Current Exercise Habits: Home exercise routine, Type of exercise: walking, Time (Minutes): 20, Frequency (Times/Week): 7, Weekly Exercise (Minutes/Week): 140, Intensity: Mild, Exercise limited by: None identified   Goals Addressed             This Visit's Progress    Quit Smoking   Not on track    Pt would like to quit smoking. Smoking cessation materials given and information provided about classes and assistance.        Depression Screen PHQ 2/9 Scores 03/22/2021 09/05/2020 03/29/2020 03/21/2020 09/22/2019 03/27/2019 03/16/2019  PHQ - 2 Score 0 3 3 3  0 2 1  PHQ- 9 Score - 9 6 6 2 8 4     Fall Risk Fall Risk  03/22/2021 09/05/2020 03/29/2020 03/21/2020 09/22/2019  Falls in the past year? 0 0 0 0 0  Number falls in past yr: 0 0 0 0 0  Injury with  Fall? 0 0 0 0 0  Risk for fall due to : No Fall Risks - - No Fall Risks No Fall Risks  Follow up Falls prevention discussed Falls evaluation completed Falls evaluation completed Falls prevention discussed Falls evaluation completed    [place]:  Any stairs in or around the home? Yes  If so, are there any without handrails? No  Home free of loose throw rugs in walkways, pet beds, electrical cords, etc? Yes  Adequate lighting in your home to reduce risk of falls? Yes   ASSISTIVE DEVICES UTILIZED TO PREVENT FALLS:  Life alert? No  Use of a cane, walker or w/c? No  Grab bars in the bathroom? No  Shower chair or bench in shower? Yes  Elevated toilet seat or a handicapped toilet? No   TIMED UP AND GO:  Was the test performed? No . Telephonic visit.   Cognitive Function: Normal cognitive status assessed by direct observation by this Nurse Health Advisor. No abnormalities found.       6CIT Screen 03/21/2020 03/10/2018 02/27/2018  What Year? 0 points 0 points 0 points  What month? 0 points 0 points 0 points  What time? 0 points 0 points 0 points  Count back from 20 0 points 0 points 0 points  Months in reverse 0 points 0 points 0 points  Repeat phrase 0 points 0 points 0 points  Total Score 0 0 0    Immunizations Immunization History  Administered Date(s) Administered   Moderna Covid-19 Vaccine Bivalent Booster 101yrs & up 01/24/2021   Moderna Sars-Covid-2 Vaccination 05/29/2019, 06/26/2019, 02/26/2020   Pneumococcal Conjugate-13 02/28/2016   Pneumococcal Polysaccharide-23 09/24/2012, 02/27/2018   Zoster Recombinat (Shingrix) 03/27/2019, 05/28/2019   Zoster, Live 12/08/2013    TDAP status: Due, Education has been provided regarding the importance of this vaccine. Advised may receive this vaccine at local pharmacy or Health Dept. Aware to provide a copy of the vaccination record if obtained from local pharmacy or Health Dept. Verbalized  acceptance and understanding.  Flu Vaccine status: Declined, Education has been provided regarding the importance of this vaccine but patient still declined. Advised may receive this vaccine at local pharmacy or Health Dept. Aware  to provide a copy of the vaccination record if obtained from local pharmacy or Health Dept. Verbalized acceptance and understanding.  Pneumococcal vaccine status: Up to date  Covid-19 vaccine status: Completed vaccines  Qualifies for Shingles Vaccine? Yes   Zostavax completed Yes   Shingrix Completed?: Yes  Screening Tests Health Maintenance  Topic Date Due   TETANUS/TDAP  Never done   INFLUENZA VACCINE  07/21/2021 (Originally 11/21/2020)   MAMMOGRAM  03/28/2021   COLONOSCOPY (Pts 45-40yrs Insurance coverage will need to be confirmed)  06/13/2027   Pneumonia Vaccine 10+ Years old  Completed   DEXA SCAN  Completed   COVID-19 Vaccine  Completed   Hepatitis C Screening  Completed   Zoster Vaccines- Shingrix  Completed   HPV VACCINES  Aged Out    Health Maintenance  Health Maintenance Due  Topic Date Due   TETANUS/TDAP  Never done    Colorectal cancer screening: Type of screening: Colonoscopy. Completed 06/12/17. Repeat every 10 years  Mammogram status: Completed 03/28/20. Repeat every year. Scheduled for 03/29/21  Bone Density status: Completed 2008. Results reflect: Bone density results: NORMAL. Repeat every 5 years. Pt declines repeat screening at this time.   Lung Cancer Screening: (Low Dose CT Chest recommended if Age 95-80 years, 30 pack-year currently smoking OR have quit w/in 15years.) does qualify. Completed 04/06/20.   Lung Cancer Screening Referral: A referral has been sent to [facility] Pulmonary Lung Cancer Screening regarding the possible need for this exam. The patient's chart will be reviewed to determine if they qualify and the patient will be contacted to facilitate the scheduling of the Low Dose Chest CT for lung cancer screening.     Additional Screening:  Hepatitis C Screening: does qualify; Completed 02/28/16  Vision Screening: Recommended annual ophthalmology exams for early detection of glaucoma and other disorders of the eye. Is the patient up to date with their annual eye exam?  Yes  Who is the provider or what is the name of the office in which the patient attends annual eye exams? Dr. Edison Pace.   Dental Screening: Recommended annual dental exams for proper oral hygiene  Community Resource Referral / Chronic Care Management: CRR required this visit?  No   CCM required this visit?  No      Plan:     I have personally reviewed and noted the following in the patient's chart:   Medical and social history Use of alcohol, tobacco or illicit drugs  Current medications and supplements including opioid prescriptions.  Functional ability and status Nutritional status Physical activity Advanced directives List of other physicians Hospitalizations, surgeries, and ER visits in previous 12 months Vitals Screenings to include cognitive, depression, and falls Referrals and appointments  In addition, I have reviewed and discussed with patient certain preventive protocols, quality metrics, and best practice recommendations. A written personalized care plan for preventive services as well as general preventive health recommendations were provided to patient.     Clemetine Marker, LPN   [id number]/[id number]   Nurse Notes: pt scheduled for CPE 04/03/21

## 2021-03-25 ENCOUNTER — Other Ambulatory Visit: Payer: Self-pay | Admitting: Internal Medicine

## 2021-03-25 DIAGNOSIS — K219 Gastro-esophageal reflux disease without esophagitis: Secondary | ICD-10-CM

## 2021-03-25 NOTE — Telephone Encounter (Signed)
Requested Prescriptions  Pending Prescriptions Disp Refills  . omeprazole (PRILOSEC) 40 MG capsule [Pharmacy Med Name: OMEPRAZOLE 40MG  CAPSULES] 30 capsule 0    Sig: TAKE ONE CAPSULE BY MOUTH DAILY     Gastroenterology: Proton Pump Inhibitors Passed - 03/25/2021  6:22 AM      Passed - Valid encounter within last 12 months    Recent Outpatient Visits          6 months ago Mixed hyperlipidemia   [facility] Glean Hess, MD   12 months ago Annual physical exam   [facility] Glean Hess, MD   1 year ago Essential hypertension   [facility] Glean Hess, MD   1 year ago Annual physical exam   [facility] Glean Hess, MD   2 years ago Essential hypertension   [facility] Glean Hess, MD      Future Appointments            In 1 week [place] Melia Jesse Sans, MD [facility]

## 2021-03-27 ENCOUNTER — Other Ambulatory Visit: Payer: Self-pay | Admitting: Internal Medicine

## 2021-03-27 DIAGNOSIS — K219 Gastro-esophageal reflux disease without esophagitis: Secondary | ICD-10-CM

## 2021-03-29 ENCOUNTER — Telehealth: Payer: Self-pay

## 2021-03-29 ENCOUNTER — Ambulatory Visit
Admission: RE | Admit: 2021-03-29 | Discharge: 2021-03-29 | Disposition: A | Discharge status: Home / Self Care | Payer: Medicare HMO | Source: Ambulatory Visit | Attending: Internal Medicine | Admitting: Internal Medicine

## 2021-03-29 ENCOUNTER — Other Ambulatory Visit: Payer: Self-pay

## 2021-03-29 DIAGNOSIS — Z1231 Encounter for screening mammogram for malignant neoplasm of breast: Secondary | ICD-10-CM | POA: Diagnosis present

## 2021-03-29 NOTE — Telephone Encounter (Signed)
Patient will address this issue at her physical.

## 2021-03-29 NOTE — Telephone Encounter (Signed)
Copied from [place] [id number]. Topic: General - Other >> Mar 29, 2021  3:27 PM Tessa Lerner A wrote: Reason for CRM: The patient has called to make the practice aware that their thumb and middle finger on their left hand are both continuing to feel sore   The patient shares that they were injured when they fell on 03/24/21  The patient would like to discuss their discomfort prior to their physical appt on 04/03/21  The patient declined to speak with a triage nurse at the time of call with agent   Please contact further when possible

## 2021-04-03 ENCOUNTER — Ambulatory Visit (INDEPENDENT_AMBULATORY_CARE_PROVIDER_SITE_OTHER): Payer: Medicare HMO | Admitting: Internal Medicine

## 2021-04-03 ENCOUNTER — Other Ambulatory Visit: Payer: Self-pay

## 2021-04-03 ENCOUNTER — Encounter: Payer: Self-pay | Admitting: Internal Medicine

## 2021-04-03 VITALS — BP 118/76 | HR 71 | Ht 64.0 in | Wt 132.0 lb

## 2021-04-03 DIAGNOSIS — Z13820 Encounter for screening for osteoporosis: Secondary | ICD-10-CM

## 2021-04-03 DIAGNOSIS — E782 Mixed hyperlipidemia: Secondary | ICD-10-CM | POA: Diagnosis not present

## 2021-04-03 DIAGNOSIS — Z0000 Encounter for general adult medical examination without abnormal findings: Secondary | ICD-10-CM

## 2021-04-03 DIAGNOSIS — I10 Essential (primary) hypertension: Secondary | ICD-10-CM

## 2021-04-03 DIAGNOSIS — S6000XA Contusion of unspecified finger without damage to nail, initial encounter: Secondary | ICD-10-CM

## 2021-04-03 DIAGNOSIS — F17200 Nicotine dependence, unspecified, uncomplicated: Secondary | ICD-10-CM

## 2021-04-03 DIAGNOSIS — K219 Gastro-esophageal reflux disease without esophagitis: Secondary | ICD-10-CM

## 2021-04-03 LAB — POCT URINALYSIS DIPSTICK
Bilirubin, UA: NEGATIVE
Blood, UA: NEGATIVE
Glucose, UA: NEGATIVE
Ketones, UA: NEGATIVE
Leukocytes, UA: NEGATIVE
Nitrite, UA: NEGATIVE
Protein, UA: NEGATIVE
Spec Grav, UA: 1.02 (ref 1.010–1.025)
Urobilinogen, UA: 0.2 E.U./dL
pH, UA: 6 (ref 5.0–8.0)

## 2021-04-03 MED ORDER — AMLODIPINE BESYLATE 5 MG PO TABS: ORAL_TABLET | ORAL | 1 refills | Status: DC

## 2021-04-03 MED ORDER — LISINOPRIL 10 MG PO TABS: ORAL_TABLET | ORAL | 1 refills | Status: DC

## 2021-04-03 MED ORDER — OMEPRAZOLE 40 MG PO CPDR: DELAYED_RELEASE_CAPSULE | ORAL | 1 refills | Status: DC

## 2021-04-03 NOTE — Progress Notes (Signed)
Date:  04/03/2021   Name:  Angela Herrera   DOB:  1951/01/31   MRN:  [id number]   Chief Complaint: Annual Exam (Breast exam no pap ) Angela Herrera is a 70 y.o. female who presents today for her Complete Annual Exam. She feels well. She reports exercising walking daily. She reports she is sleeping well. Breast complaints none.  Mammogram: 03/2021 DEXA: 2008 Normal Pap smear: discontinued Colonoscopy: 05/2017  Immunization History  Administered Date(s) Administered   Moderna Covid-19 Vaccine Bivalent Booster 78yrs & up 01/24/2021   Moderna Sars-Covid-2 Vaccination 05/29/2019, 06/26/2019, 02/26/2020   Pneumococcal Conjugate-13 02/28/2016   Pneumococcal Polysaccharide-23 09/24/2012, 02/27/2018   Zoster Recombinat (Shingrix) 03/27/2019, 05/28/2019   Zoster, Live 12/08/2013    Nicotine Dependence Presents for follow-up visit. Symptoms are negative for fatigue. Her urge triggers include company of smokers. The symptoms have been stable. She smokes 1 pack of cigarettes per day.  Hypertension This is a chronic problem. The problem is controlled. Pertinent negatives include no chest pain, headaches, palpitations or shortness of breath. Past treatments include calcium channel blockers and ACE inhibitors. The current treatment provides significant improvement.  Hyperlipidemia This is a chronic problem. The problem is uncontrolled. Pertinent negatives include no chest pain or shortness of breath. Current antihyperlipidemic treatment includes statins (changed to crestor 6 mo ago).  Hand Pain  The incident occurred more than 1 week ago. The incident occurred at home. The injury mechanism was a fall. The pain is present in the left fingers. Quality: stiff and mildly swollen. The pain does not radiate. The patient is experiencing no pain. Pertinent negatives include no chest pain. She has tried nothing for the symptoms. She is participating in the lung cancer screening program.  Will refer to  [place].  Lab Results  Component Value Date   NA 141 03/29/2020   K 4.2 03/29/2020   CO2 25 03/29/2020   GLUCOSE 86 03/29/2020   BUN 12 03/29/2020   CREATININE 0.68 03/29/2020   CALCIUM 9.6 03/29/2020   GFRNONAA 89 03/29/2020   Lab Results  Component Value Date   CHOL 242 (H) 09/05/2020   HDL 66 09/05/2020   LDLCALC 146 (H) 09/05/2020   TRIG 170 (H) 09/05/2020   CHOLHDL 3.7 09/05/2020   Lab Results  Component Value Date   TSH 2.650 03/29/2020   No results found for: HGBA1C Lab Results  Component Value Date   WBC 5.9 03/29/2020   HGB 13.7 03/29/2020   HCT 41.5 03/29/2020   MCV 88 03/29/2020   PLT 426 03/29/2020   Lab Results  Component Value Date   ALT 11 03/29/2020   AST 22 03/29/2020   ALKPHOS 95 03/29/2020   BILITOT 0.6 03/29/2020   No results found for: 25OHVITD2, 25OHVITD3, VD25OH   Review of Systems  Constitutional:  Negative for chills, fatigue and fever.  HENT:  Negative for congestion, hearing loss, tinnitus, trouble swallowing and voice change.   Eyes:  Negative for visual disturbance.  Respiratory:  Negative for cough, chest tightness, shortness of breath and wheezing.   Cardiovascular:  Negative for chest pain, palpitations and leg swelling.  Gastrointestinal:  Negative for abdominal pain, constipation, diarrhea and vomiting.  Endocrine: Negative for polydipsia and polyuria.  Genitourinary:  Negative for dysuria, frequency, genital sores, vaginal bleeding and vaginal discharge.  Musculoskeletal:  Positive for arthralgias (left fingers - thumb and middle). Negative for gait problem and joint swelling.  Skin:  Negative for color change and rash.  Neurological:  Negative for dizziness, tremors, light-headedness and headaches.  Hematological:  Negative for adenopathy. Does not bruise/bleed easily.  Psychiatric/Behavioral:  Negative for dysphoric mood and sleep disturbance. The patient is not nervous/anxious.    Patient Active Problem List   Diagnosis  Date Noted   Aortic atherosclerosis ([place]) 09/25/2018   Hemangioma of liver 03/24/2018   GERD without esophagitis 09/14/2015   Peripheral vascular disease ([place]) 02/23/2015   Tobacco use disorder 02/23/2015   Mixed hyperlipidemia 10/22/2014   Environmental and seasonal allergies 10/22/2014   Essential hypertension 10/22/2014    Allergies  Allergen Reactions   Tetracycline Itching    Past Surgical History:  Procedure Laterality Date   COLONOSCOPY  08/05/2006   normal   COLONOSCOPY WITH PROPOFOL N/A 06/12/2017   Procedure: COLONOSCOPY WITH PROPOFOL;  Surgeon: Lin Landsman, MD;  Location: [facility];  Service: Endoscopy;  Laterality: N/A;   TUBAL LIGATION Bilateral     Social History   Tobacco Use   Smoking status: Every Day    Packs/day: 1.00    Years: 52.00    Pack years: 52.00    Types: Cigarettes    Start date: 1965   Smokeless tobacco: Never   Tobacco comments:    since age 41; quit 02/2019 after dental work; started smoking again 11/2019; smoking .75 PPD 03/22/21  Vaping Use   Vaping Use: Never used  Substance Use Topics   Alcohol use: Yes    Alcohol/week: 4.0 standard drinks    Types: 4 Shots of liquor per week   Drug use: No     Medication list has been reviewed and updated.  Current Meds  Medication Sig   amLODipine (NORVASC) 5 MG tablet TAKE 1 TABLET(5 MG) BY MOUTH DAILY   aspirin 81 MG tablet Take 81 mg by mouth daily.   cholecalciferol (VITAMIN D3) 25 MCG (1000 UT) tablet Take 1,000 Units by mouth daily.   lisinopril (ZESTRIL) 10 MG tablet TAKE 1 TABLET(10 MG) BY MOUTH DAILY   Multiple Vitamins-Minerals (MULTIVITAMIN WITH MINERALS) tablet Take 1 tablet daily by mouth.   omeprazole (PRILOSEC) 40 MG capsule TAKE ONE CAPSULE BY MOUTH DAILY   rosuvastatin (CRESTOR) 10 MG tablet Take 1 tablet (10 mg total) by mouth daily.    PHQ 2/9 Scores 04/03/2021 03/22/2021 09/05/2020 03/29/2020  PHQ - 2 Score 2 0 3 3  PHQ- 9 Score 8 - 9 6    GAD 7 :  Generalized Anxiety Score 04/03/2021 09/05/2020 03/29/2020 09/22/2019  Nervous, Anxious, on Edge 0 0 0 0  Control/stop worrying 0 0 0 0  Worry too much - different things 1 0 0 0  Trouble relaxing 2 2 0 0  Restless 1 0 0 0  Easily annoyed or irritable 1 0 0 0  Afraid - awful might happen 0 0 0 0  Total GAD 7 Score 5 2 0 0  Anxiety Difficulty - Not difficult at all Not difficult at all Not difficult at all    BP Readings from Last 3 Encounters:  04/03/21 118/76  03/22/21 129/87  09/05/20 110/72    Physical Exam Vitals and nursing note reviewed.  Constitutional:      General: She is not in acute distress.    Appearance: She is well-developed.  HENT:     Head: Normocephalic and atraumatic.     Right Ear: Tympanic membrane and ear canal normal.     Left Ear: Tympanic membrane and ear canal normal.     Nose:  Right Sinus: No maxillary sinus tenderness.     Left Sinus: No maxillary sinus tenderness.  Eyes:     General: No scleral icterus.       Right eye: No discharge.        Left eye: No discharge.     Conjunctiva/sclera: Conjunctivae normal.  Neck:     Thyroid: No thyromegaly.     Vascular: No carotid bruit.  Cardiovascular:     Rate and Rhythm: Normal rate and regular rhythm.     Pulses: Normal pulses.     Heart sounds: Normal heart sounds.  Pulmonary:     Effort: Pulmonary effort is normal. No respiratory distress.     Breath sounds: No wheezing.  Chest:     Comments: Declined due to recent mammogram Abdominal:     General: Bowel sounds are normal.     Palpations: Abdomen is soft.     Tenderness: There is no abdominal tenderness.  Musculoskeletal:     Left hand: Swelling (DIP of thumb and middle finger) and tenderness (mild joint tenderness) present. Normal strength.     Cervical back: Normal range of motion. No erythema.     Right lower leg: No edema.     Left lower leg: No edema.  Lymphadenopathy:     Cervical: No cervical adenopathy.  Skin:    General: Skin  is warm and dry.     Findings: No rash.  Neurological:     Mental Status: She is alert and oriented to person, place, and time.     Cranial Nerves: No cranial nerve deficit.     Sensory: No sensory deficit.     Deep Tendon Reflexes: Reflexes are normal and symmetric.  Psychiatric:        Attention and Perception: Attention normal.        Mood and Affect: Mood normal.    Wt Readings from Last 3 Encounters:  04/03/21 132 lb (59.9 kg)  03/22/21 124 lb (56.2 kg)  09/05/20 126 lb (57.2 kg)    BP 118/76   Pulse 71   Ht 5\' 4"  (1.626 m)   Wt 132 lb (59.9 kg)   SpO2 98%   BMI 22.66 kg/m   Assessment and Plan: 1. Annual physical exam Continue healthy diet and regular exercise Declines DEXA and Flu vaccines - Hemoglobin A1c  2. Encounter for screening for osteoporosis Declines DEXA Continue Calcium and vitamin D  3. Essential hypertension Clinically stable exam with well controlled BP. Tolerating medications without side effects at this time. Pt to continue current regimen and low sodium diet; benefits of regular exercise as able discussed. - CBC with Differential/Platelet - POCT urinalysis dipstick - TSH - amLODipine (NORVASC) 5 MG tablet; TAKE 1 TABLET(5 MG) BY MOUTH DAILY  Dispense: 90 tablet; Refill: 1 - lisinopril (ZESTRIL) 10 MG tablet; TAKE 1 TABLET(10 MG) BY MOUTH DAILY  Dispense: 90 tablet; Refill: 1  4. Mixed hyperlipidemia Now on Crestor without sx - Comprehensive metabolic panel - Lipid panel  5. Tobacco use disorder Not ready to quit at this time Continue yearly LDCT screening  6. Gastroesophageal reflux disease, unspecified whether esophagitis present Symptoms well controlled on daily PPI No red flag signs such as weight loss, n/v, melena Will continue omeprazole. - omeprazole (PRILOSEC) 40 MG capsule; TAKE ONE CAPSULE BY MOUTH DAILY  Dispense: 90 capsule; Refill: 1  7. Contusion of finger of left hand, initial encounter Tylenol bid; rest; follow up if  not improving   Partially dictated using  Editor, commissioning. Any errors are unintentional.  Halina Maidens, MD [facility]  04/03/2021

## 2021-04-04 ENCOUNTER — Other Ambulatory Visit: Payer: Self-pay | Admitting: *Deleted

## 2021-04-04 DIAGNOSIS — F17210 Nicotine dependence, cigarettes, uncomplicated: Secondary | ICD-10-CM

## 2021-04-04 DIAGNOSIS — Z87891 Personal history of nicotine dependence: Secondary | ICD-10-CM

## 2021-04-04 LAB — LIPID PANEL
Chol/HDL Ratio: 3.8 ratio (ref 0.0–4.4)
Cholesterol, Total: 165 mg/dL (ref 100–199)
HDL: 43 mg/dL (ref >39)
LDL Chol Calc (NIH): 94 mg/dL (ref 0–99)
Triglycerides: 158 mg/dL — ABNORMAL HIGH (ref 0–149)
VLDL Cholesterol Cal: 28 mg/dL (ref 5–40)

## 2021-04-04 LAB — COMPREHENSIVE METABOLIC PANEL
ALT: 10 IU/L (ref 0–32)
AST: 15 IU/L (ref 0–40)
Albumin/Globulin Ratio: 1.6 (ref 1.2–2.2)
Albumin: 4.3 g/dL (ref 3.7–4.7)
Alkaline Phosphatase: 74 IU/L (ref 44–121)
BUN/Creatinine Ratio: 14 (ref 12–28)
BUN: 10 mg/dL (ref 8–27)
Bilirubin Total: 0.3 mg/dL (ref 0.0–1.2)
CO2: 25 mmol/L (ref 20–29)
Calcium: 9.7 mg/dL (ref 8.7–10.3)
Chloride: 103 mmol/L (ref 96–106)
Creatinine, Ser: 0.71 mg/dL (ref 0.57–1.00)
Globulin, Total: 2.7 g/dL (ref 1.5–4.5)
Glucose: 86 mg/dL (ref 70–99)
Potassium: 4.5 mmol/L (ref 3.5–5.2)
Sodium: 141 mmol/L (ref 134–144)
Total Protein: 7 g/dL (ref 6.0–8.5)
eGFR: 91 mL/min/{1.73_m2} (ref >59)

## 2021-04-04 LAB — CBC WITH DIFFERENTIAL/PLATELET
Basophils Absolute: 0 10*3/uL (ref 0.0–0.2)
Basos: 1 %
EOS (ABSOLUTE): 0.3 10*3/uL (ref 0.0–0.4)
Eos: 6 %
Hematocrit: 42.8 % (ref 34.0–46.6)
Hemoglobin: 14.1 g/dL (ref 11.1–15.9)
Immature Grans (Abs): 0 10*3/uL (ref 0.0–0.1)
Immature Granulocytes: 0 %
Lymphocytes Absolute: 2.5 10*3/uL (ref 0.7–3.1)
Lymphs: 41 %
MCH: 28.3 pg (ref 26.6–33.0)
MCHC: 32.9 g/dL (ref 31.5–35.7)
MCV: 86 fL (ref 79–97)
Monocytes Absolute: 0.6 10*3/uL (ref 0.1–0.9)
Monocytes: 11 %
Neutrophils Absolute: 2.5 10*3/uL (ref 1.4–7.0)
Neutrophils: 41 %
Platelets: 430 10*3/uL (ref 150–450)
RBC: 4.99 x10E6/uL (ref 3.77–5.28)
RDW: 13.8 % (ref 11.7–15.4)
WBC: 5.9 10*3/uL (ref 3.4–10.8)

## 2021-04-04 LAB — TSH: TSH: 2.36 u[IU]/mL (ref 0.450–4.500)

## 2021-04-04 LAB — HEMOGLOBIN A1C
Est. average glucose Bld gHb Est-mCnc: 111 mg/dL
Hgb A1c MFr Bld: 5.5 % (ref 4.8–5.6)

## 2021-04-18 ENCOUNTER — Ambulatory Visit: Payer: Medicare HMO

## 2021-04-25 ENCOUNTER — Other Ambulatory Visit: Payer: Self-pay

## 2021-04-25 ENCOUNTER — Ambulatory Visit
Admission: RE | Admit: 2021-04-25 | Discharge: 2021-04-25 | Disposition: A | Discharge status: Home / Self Care | Payer: Medicare HMO | Source: Ambulatory Visit | Attending: Acute Care | Admitting: Acute Care

## 2021-04-25 DIAGNOSIS — F17210 Nicotine dependence, cigarettes, uncomplicated: Secondary | ICD-10-CM | POA: Insufficient documentation

## 2021-04-25 DIAGNOSIS — Z87891 Personal history of nicotine dependence: Secondary | ICD-10-CM | POA: Diagnosis present

## 2021-04-26 ENCOUNTER — Other Ambulatory Visit: Payer: Self-pay | Admitting: Acute Care

## 2021-04-26 DIAGNOSIS — F17210 Nicotine dependence, cigarettes, uncomplicated: Secondary | ICD-10-CM

## 2021-04-26 DIAGNOSIS — Z87891 Personal history of nicotine dependence: Secondary | ICD-10-CM

## 2021-05-16 ENCOUNTER — Other Ambulatory Visit: Payer: Self-pay | Admitting: Internal Medicine

## 2021-05-16 DIAGNOSIS — K219 Gastro-esophageal reflux disease without esophagitis: Secondary | ICD-10-CM

## 2021-05-16 NOTE — Telephone Encounter (Signed)
Requested Prescriptions  Pending Prescriptions Disp Refills   omeprazole (PRILOSEC) 40 MG capsule [Pharmacy Med Name: OMEPRAZOLE 40MG  CAPSULES] 30 capsule     Sig: TAKE 1 CAPSULE BY MOUTH DAILY     Gastroenterology: Proton Pump Inhibitors Passed - 05/16/2021  3:35 AM      Passed - Valid encounter within last 12 months    Recent Outpatient Visits          1 month ago Annual physical exam   [facility] Glean Hess, MD   8 months ago Mixed hyperlipidemia   [facility] Glean Hess, MD   1 year ago Annual physical exam   [facility] Glean Hess, MD   1 year ago Essential hypertension   [facility] Glean Hess, MD   2 years ago Annual physical exam   [facility] Glean Hess, MD      Future Appointments            In 4 months Army Melia Jesse Sans, MD [facility], [place]   In 10 months Glean Hess, MD [facility], [place]           Refilled 04/03/2021 #90 with 1 refill

## 2021-09-04 ENCOUNTER — Other Ambulatory Visit: Payer: Self-pay | Admitting: Internal Medicine

## 2021-09-05 ENCOUNTER — Other Ambulatory Visit: Payer: Self-pay | Admitting: Internal Medicine

## 2021-09-05 DIAGNOSIS — I10 Essential (primary) hypertension: Secondary | ICD-10-CM

## 2021-09-05 NOTE — Telephone Encounter (Signed)
Requested Prescriptions  ?Pending Prescriptions Disp Refills  ?? rosuvastatin (CRESTOR) 10 MG tablet [Pharmacy Med Name: ROSUVASTATIN '10MG'$  TABLETS] 90 tablet 0  ?  Sig: TAKE 1 TABLET(10 MG) BY MOUTH DAILY  ?  ? Cardiovascular:  Antilipid - Statins 2 Failed - 09/04/2021  9:22 AM  ?  ?  Failed - Lipid Panel in normal range within the last 12 months  ?  Cholesterol, Total  ?Date Value Ref Range Status  ?04/03/2021 165 100 - 199 mg/dL Final  ? ?LDL Chol Calc (NIH)  ?Date Value Ref Range Status  ?04/03/2021 94 0 - 99 mg/dL Final  ? ?HDL  ?Date Value Ref Range Status  ?04/03/2021 43 >39 mg/dL Final  ? ?Triglycerides  ?Date Value Ref Range Status  ?04/03/2021 158 (H) 0 - 149 mg/dL Final  ? ?  ?  ?  Passed - Cr in normal range and within 360 days  ?  Creatinine, Ser  ?Date Value Ref Range Status  ?04/03/2021 0.71 0.57 - 1.00 mg/dL Final  ?   ?  ?  Passed - Patient is not pregnant  ?  ?  Passed - Valid encounter within last 12 months  ?  Recent Outpatient Visits   ?      ? 5 months ago Annual physical exam  ? [facility] Glean Hess, MD  ? 1 year ago Mixed hyperlipidemia  ? [facility] Glean Hess, MD  ? 1 year ago Annual physical exam  ? [facility] Glean Hess, MD  ? 1 year ago Essential hypertension  ? [facility] Glean Hess, MD  ? 2 years ago Annual physical exam  ? [facility] Glean Hess, MD  ?  ?  ?Future Appointments   ?        ? In 3 weeks Army Melia Jesse Sans, MD [facility], [place]  ? In 7 months Army Melia Jesse Sans, MD [facility], [place]  ?  ? ?  ?  ?  ? ?

## 2021-09-06 NOTE — Telephone Encounter (Signed)
Filled 09/05/21 #90. ?

## 2021-09-14 ENCOUNTER — Other Ambulatory Visit: Payer: Self-pay | Admitting: Internal Medicine

## 2021-09-14 DIAGNOSIS — K219 Gastro-esophageal reflux disease without esophagitis: Secondary | ICD-10-CM

## 2021-09-14 DIAGNOSIS — I10 Essential (primary) hypertension: Secondary | ICD-10-CM

## 2021-09-15 NOTE — Telephone Encounter (Signed)
Refused Zestril 10 mg and Prilosec 40 mg because being requested too early.  Both 04/03/2021 #90, 1 refill

## 2021-10-02 ENCOUNTER — Ambulatory Visit (INDEPENDENT_AMBULATORY_CARE_PROVIDER_SITE_OTHER): Payer: Medicare HMO | Admitting: Internal Medicine

## 2021-10-02 ENCOUNTER — Encounter: Payer: Self-pay | Admitting: Internal Medicine

## 2021-10-02 VITALS — BP 124/60 | HR 72 | Ht 64.0 in | Wt 131.2 lb

## 2021-10-02 DIAGNOSIS — E782 Mixed hyperlipidemia: Secondary | ICD-10-CM

## 2021-10-02 DIAGNOSIS — I700 Atherosclerosis of aorta: Secondary | ICD-10-CM | POA: Diagnosis not present

## 2021-10-02 DIAGNOSIS — I10 Essential (primary) hypertension: Secondary | ICD-10-CM | POA: Diagnosis not present

## 2021-10-02 DIAGNOSIS — Z1231 Encounter for screening mammogram for malignant neoplasm of breast: Secondary | ICD-10-CM

## 2021-10-02 DIAGNOSIS — K219 Gastro-esophageal reflux disease without esophagitis: Secondary | ICD-10-CM | POA: Diagnosis not present

## 2021-10-02 MED ORDER — AMLODIPINE BESYLATE 5 MG PO TABS: ORAL_TABLET | ORAL | 1 refills | Status: DC

## 2021-10-02 MED ORDER — OMEPRAZOLE 40 MG PO CPDR: DELAYED_RELEASE_CAPSULE | ORAL | 1 refills | Status: DC

## 2021-10-02 MED ORDER — ROSUVASTATIN CALCIUM 10 MG PO TABS: ORAL_TABLET | ORAL | 1 refills | Status: DC

## 2021-10-02 MED ORDER — LISINOPRIL 10 MG PO TABS: ORAL_TABLET | ORAL | 1 refills | Status: DC

## 2021-10-02 NOTE — Progress Notes (Signed)
Date:  10/02/2021   Name:  Angela Herrera   DOB:  1950/10/06   MRN:  [id number]   Chief Complaint: Hypertension  Hypertension This is a chronic problem. The problem is controlled (at  117/74). Pertinent negatives include no chest pain, headaches, palpitations or shortness of breath. Past treatments include calcium channel blockers and ACE inhibitors. There is no history of kidney disease, CAD/MI or CVA.  Gastroesophageal Reflux She reports no abdominal pain, no chest pain, no coughing, no heartburn or no wheezing. This is a recurrent problem. The problem occurs rarely. Pertinent negatives include no fatigue. She has tried a PPI for the symptoms. The treatment provided significant relief.    Lab Results  Component Value Date   NA 141 04/03/2021   K 4.5 04/03/2021   CO2 25 04/03/2021   GLUCOSE 86 04/03/2021   BUN 10 04/03/2021   CREATININE 0.71 04/03/2021   CALCIUM 9.7 04/03/2021   EGFR 91 04/03/2021   GFRNONAA 89 03/29/2020   Lab Results  Component Value Date   CHOL 165 04/03/2021   HDL 43 04/03/2021   LDLCALC 94 04/03/2021   TRIG 158 (H) 04/03/2021   CHOLHDL 3.8 04/03/2021   Lab Results  Component Value Date   TSH 2.360 04/03/2021   Lab Results  Component Value Date   HGBA1C 5.5 04/03/2021   Lab Results  Component Value Date   WBC 5.9 04/03/2021   HGB 14.1 04/03/2021   HCT 42.8 04/03/2021   MCV 86 04/03/2021   PLT 430 04/03/2021   Lab Results  Component Value Date   ALT 10 04/03/2021   AST 15 04/03/2021   ALKPHOS 74 04/03/2021   BILITOT 0.3 04/03/2021   No results found for: "25OHVITD2", "25OHVITD3", "VD25OH"   Review of Systems  Constitutional:  Negative for fatigue and unexpected weight change.  HENT:  Negative for nosebleeds.   Eyes:  Negative for visual disturbance.  Respiratory:  Negative for cough, chest tightness, shortness of breath and wheezing.   Cardiovascular:  Negative for chest pain, palpitations and leg swelling.  Gastrointestinal:   Negative for abdominal pain, constipation, diarrhea and heartburn.  Neurological:  Negative for dizziness, weakness, light-headedness and headaches.    Patient Active Problem List   Diagnosis Date Noted   Aortic atherosclerosis ([place]) 09/25/2018   Hemangioma of liver 03/24/2018   GERD without esophagitis 09/14/2015   Peripheral vascular disease ([place]) 02/23/2015   Tobacco use disorder 02/23/2015   Mixed hyperlipidemia 10/22/2014   Environmental and seasonal allergies 10/22/2014   Essential hypertension 10/22/2014    Allergies  Allergen Reactions   Tetracycline Itching    Past Surgical History:  Procedure Laterality Date   COLONOSCOPY  08/05/2006   normal   COLONOSCOPY WITH PROPOFOL N/A 06/12/2017   Procedure: COLONOSCOPY WITH PROPOFOL;  Surgeon: Lin Landsman, MD;  Location: [facility];  Service: Endoscopy;  Laterality: N/A;   TUBAL LIGATION Bilateral     Social History   Tobacco Use   Smoking status: Every Day    Packs/day: 1.00    Years: 52.00    Total pack years: 52.00    Types: Cigarettes    Start date: 1965   Smokeless tobacco: Never   Tobacco comments:    since age 6; quit 02/2019 after dental work; started smoking again 11/2019; smoking .75 PPD 03/22/21  Vaping Use   Vaping Use: Never used  Substance Use Topics   Alcohol use: Yes    Alcohol/week: 4.0 standard drinks of alcohol  Types: 4 Shots of liquor per week   Drug use: No     Medication list has been reviewed and updated.  Current Meds  Medication Sig   amLODipine (NORVASC) 5 MG tablet TAKE 1 TABLET(5 MG) BY MOUTH DAILY   aspirin 81 MG tablet Take 81 mg by mouth daily.   cholecalciferol (VITAMIN D3) 25 MCG (1000 UT) tablet Take 1,000 Units by mouth daily.   lisinopril (ZESTRIL) 10 MG tablet TAKE 1 TABLET(10 MG) BY MOUTH DAILY   Multiple Vitamins-Minerals (MULTIVITAMIN WITH MINERALS) tablet Take 1 tablet daily by mouth.   omeprazole (PRILOSEC) 40 MG capsule TAKE ONE CAPSULE BY  MOUTH DAILY   rosuvastatin (CRESTOR) 10 MG tablet TAKE 1 TABLET(10 MG) BY MOUTH DAILY       10/02/2021    8:15 AM 04/03/2021    8:31 AM 09/05/2020    8:26 AM 03/29/2020    8:08 AM  GAD 7 : Generalized Anxiety Score  Nervous, Anxious, on Edge 1 0 0 0  Control/stop worrying 0 0 0 0  Worry too much - different things 1 1 0 0  Trouble relaxing _0 0  Restless 1 1 0 0  Easily annoyed or irritable 1 1 0 0  Afraid - awful might happen 0 0 0 0  Total GAD 7 Score _1 0  Anxiety Difficulty Not difficult at all  Not difficult at all Not difficult at all       10/02/2021    8:15 AM  Depression screen PHQ 2/9  Decreased Interest 0  Down, Depressed, Hopeless 1  PHQ - 2 Score 1  Altered sleeping 1  Tired, decreased energy 1  Change in appetite 1  Feeling bad or failure about yourself  0  Trouble concentrating 0  Moving slowly or fidgety/restless 0  Suicidal thoughts 0  PHQ-9 Score 4  Difficult doing work/chores Not difficult at all    BP Readings from Last 3 Encounters:  10/02/21 124/60  04/03/21 118/76  03/22/21 129/87    Physical Exam Vitals and nursing note reviewed.  Constitutional:      General: She is not in acute distress.    Appearance: She is well-developed.  HENT:     Head: Normocephalic and atraumatic.  Neck:     Vascular: No carotid bruit.  Cardiovascular:     Rate and Rhythm: Normal rate and regular rhythm.     Pulses: Normal pulses.     Heart sounds: No murmur heard. Pulmonary:     Effort: Pulmonary effort is normal. No respiratory distress.  Musculoskeletal:     Cervical back: Normal range of motion.     Right lower leg: No edema.     Left lower leg: No edema.  Lymphadenopathy:     Cervical: No cervical adenopathy.  Skin:    General: Skin is warm and dry.     Findings: No rash.  Neurological:     Mental Status: She is alert and oriented to person, place, and time.  Psychiatric:        Mood and Affect: Mood normal.        Behavior: Behavior  normal.     Wt Readings from Last 3 Encounters:  10/02/21 131 lb 3.2 oz (59.5 kg)  04/03/21 132 lb (59.9 kg)  03/22/21 124 lb (56.2 kg)    BP 124/60   Pulse 72   Ht _2  (1.626 m)   Wt 131 lb 3.2 oz (59.5 kg)   SpO2  97%   BMI 22.52 kg/m   Assessment and Plan: 1. Essential hypertension Clinically stable exam with well controlled BP. Tolerating medications without side effects at this time. Pt to continue current regimen and low sodium diet; benefits of regular exercise as able discussed. - lisinopril (ZESTRIL) 10 MG tablet; TAKE 1 TABLET(10 MG) BY MOUTH DAILY  Dispense: 90 tablet; Refill: 1 - amLODipine (NORVASC) 5 MG tablet; TAKE 1 TABLET(5 MG) BY MOUTH DAILY  Dispense: 90 tablet; Refill: 1  2. Gastroesophageal reflux disease, unspecified whether esophagitis present controlled - omeprazole (PRILOSEC) 40 MG capsule; TAKE ONE CAPSULE BY MOUTH DAILY  Dispense: 90 capsule; Refill: 1  3. Mixed hyperlipidemia - rosuvastatin (CRESTOR) 10 MG tablet; TAKE 1 TABLET(10 MG) BY MOUTH DAILY  Dispense: 90 tablet; Refill: 1  4. Aortic atherosclerosis ([place]) On statin therapy She is cutting back on smoking as well.    5. Encounter for screening mammogram for breast cancer - MM 3D SCREEN BREAST BILATERAL   Partially dictated using Editor, commissioning. Any errors are unintentional.  Halina Maidens, MD [facility]  10/02/2021

## 2021-12-04 ENCOUNTER — Other Ambulatory Visit: Payer: Self-pay | Admitting: Internal Medicine

## 2021-12-04 DIAGNOSIS — I10 Essential (primary) hypertension: Secondary | ICD-10-CM

## 2021-12-04 NOTE — Telephone Encounter (Signed)
Refilled 10/02/2021 #90 1 refill - confirmed by same pharmacy. Requested Prescriptions  Pending Prescriptions Disp Refills  . lisinopril (ZESTRIL) 10 MG tablet [Pharmacy Med Name: LISINOPRIL '10MG'$  TABLETS] 90 tablet 1    Sig: TAKE 1 TABLET(10 MG) BY MOUTH DAILY     Cardiovascular:  ACE Inhibitors Failed - 12/04/2021  9:40 AM      Failed - Cr in normal range and within 180 days    Creatinine, Ser  Date Value Ref Range Status  04/03/2021 0.71 0.57 - 1.00 mg/dL Final         Failed - K in normal range and within 180 days    Potassium  Date Value Ref Range Status  04/03/2021 4.5 3.5 - 5.2 mmol/L Final         Passed - Patient is not pregnant      Passed - Last BP in normal range    BP Readings from Last 1 Encounters:  10/02/21 124/60         Passed - Valid encounter within last 6 months    Recent Outpatient Visits          2 months ago Essential hypertension   [facility] Glean Hess, MD   8 months ago Annual physical exam   [facility] Glean Hess, MD   1 year ago Mixed hyperlipidemia   [facility] Glean Hess, MD   1 year ago Annual physical exam   [facility] Glean Hess, MD   2 years ago Essential hypertension   [facility] Glean Hess, MD      Future Appointments            In 4 months Army Melia Jesse Sans, MD [facility], [facility]

## 2021-12-05 ENCOUNTER — Other Ambulatory Visit: Payer: Self-pay | Admitting: Internal Medicine

## 2021-12-05 DIAGNOSIS — E782 Mixed hyperlipidemia: Secondary | ICD-10-CM

## 2021-12-05 NOTE — Telephone Encounter (Signed)
last RF 10/02/21 #90 1 RF   Requested Prescriptions  Refused Prescriptions Disp Refills  . rosuvastatin (CRESTOR) 10 MG tablet [Pharmacy Med Name: ROSUVASTATIN '10MG'$  TABLETS] 90 tablet 1    Sig: TAKE 1 TABLET(10 MG) BY MOUTH DAILY     Cardiovascular:  Antilipid - Statins 2 Failed - 12/05/2021  9:43 AM      Failed - Lipid Panel in normal range within the last 12 months    Cholesterol, Total  Date Value Ref Range Status  04/03/2021 165 100 - 199 mg/dL Final   LDL Chol Calc (NIH)  Date Value Ref Range Status  04/03/2021 94 0 - 99 mg/dL Final   HDL  Date Value Ref Range Status  04/03/2021 43 >39 mg/dL Final   Triglycerides  Date Value Ref Range Status  04/03/2021 158 (H) 0 - 149 mg/dL Final         Passed - Cr in normal range and within 360 days    Creatinine, Ser  Date Value Ref Range Status  04/03/2021 0.71 0.57 - 1.00 mg/dL Final         Passed - Patient is not pregnant      Passed - Valid encounter within last 12 months    Recent Outpatient Visits          2 months ago Essential hypertension   [facility] Glean Hess, MD   8 months ago Annual physical exam   [facility] Glean Hess, MD   1 year ago Mixed hyperlipidemia   [facility] Glean Hess, MD   1 year ago Annual physical exam   [facility] Glean Hess, MD   2 years ago Essential hypertension   [facility] Glean Hess, MD      Future Appointments            In 4 months Army Melia Jesse Sans, MD [facility], [facility]

## 2022-02-10 ENCOUNTER — Other Ambulatory Visit: Payer: Self-pay | Admitting: Internal Medicine

## 2022-02-10 DIAGNOSIS — I10 Essential (primary) hypertension: Secondary | ICD-10-CM

## 2022-02-12 NOTE — Telephone Encounter (Signed)
Refilled 10/02/2021 #90 1 rf - confirmed by same pharmacy. Requested Prescriptions  Pending Prescriptions Disp Refills  . lisinopril (ZESTRIL) 10 MG tablet [Pharmacy Med Name: LISINOPRIL '10MG'$  TABLETS] 90 tablet 1    Sig: TAKE 1 TABLET(10 MG) BY MOUTH DAILY     Cardiovascular:  ACE Inhibitors Failed - 02/10/2022 11:08 AM      Failed - Cr in normal range and within 180 days    Creatinine, Ser  Date Value Ref Range Status  04/03/2021 0.71 0.57 - 1.00 mg/dL Final         Failed - K in normal range and within 180 days    Potassium  Date Value Ref Range Status  04/03/2021 4.5 3.5 - 5.2 mmol/L Final         Passed - Patient is not pregnant      Passed - Last BP in normal range    BP Readings from Last 1 Encounters:  10/02/21 124/60         Passed - Valid encounter within last 6 months    Recent Outpatient Visits          4 months ago Essential hypertension   [place] [facility] at [facility], Jesse Sans, MD   10 months ago Annual physical exam   [place] [facility] at [facility], Jesse Sans, MD   1 year ago Mixed hyperlipidemia   [place] [facility] at [facility], Jesse Sans, MD   1 year ago Annual physical exam   [facility] at [facility], Jesse Sans, MD   2 years ago Essential hypertension   [place] [facility] at [facility], Jesse Sans, MD      Future Appointments            In 1 month Army Melia, Jesse Sans, MD [place] [facility] at [facility]

## 2022-03-26 ENCOUNTER — Ambulatory Visit: Payer: Medicare HMO

## 2022-03-27 ENCOUNTER — Other Ambulatory Visit: Payer: Self-pay | Admitting: Internal Medicine

## 2022-03-27 DIAGNOSIS — K219 Gastro-esophageal reflux disease without esophagitis: Secondary | ICD-10-CM

## 2022-03-27 DIAGNOSIS — E782 Mixed hyperlipidemia: Secondary | ICD-10-CM

## 2022-03-27 NOTE — Telephone Encounter (Signed)
Requested Prescriptions  Pending Prescriptions Disp Refills   omeprazole (PRILOSEC) 40 MG capsule [Pharmacy Med Name: OMEPRAZOLE '40MG'$  CAPSULES] 90 capsule 1    Sig: TAKE 1 CAPSULE BY MOUTH DAILY     Gastroenterology: Proton Pump Inhibitors Passed - 03/27/2022  6:23 AM      Passed - Valid encounter within last 12 months    Recent Outpatient Visits           5 months ago Essential hypertension   [place] [facility] at [facility], Jesse Sans, MD   11 months ago Annual physical exam   [place] [facility] at [facility], Jesse Sans, MD   1 year ago Mixed hyperlipidemia   [place] [facility] at [facility], Jesse Sans, MD   1 year ago Annual physical exam   [place] [facility] at [facility], Jesse Sans, MD   2 years ago Essential hypertension   [place] [facility] at [facility], Jesse Sans, MD       Future Appointments             In 1 week Glean Hess, MD [facility] at [facility] [place]             rosuvastatin ([place]) 10 MG tablet [Pharmacy Med Name: ROSUVASTATIN '10MG'$  TABLETS] 90 tablet 0    Sig: TAKE 1 TABLET(10 MG) BY MOUTH DAILY     Cardiovascular:  Antilipid - Statins 2 Failed - 03/27/2022  6:23 AM      Failed - Lipid Panel in normal range within the last 12 months    Cholesterol, Total  Date Value Ref Range Status  04/03/2021 165 100 - 199 mg/dL Final   LDL Chol Calc (NIH)  Date Value Ref Range Status  04/03/2021 94 0 - 99 mg/dL Final   HDL  Date Value Ref Range Status  04/03/2021 43 >39 mg/dL Final   Triglycerides  Date Value Ref Range Status  04/03/2021 158 (H) 0 - 149 mg/dL Final         Passed - Cr in normal range and within 360 days    Creatinine, Ser  Date Value Ref Range Status  04/03/2021 0.71 0.57 - 1.00 mg/dL Final          Passed - Patient is not pregnant      Passed - Valid encounter within last 12 months    Recent Outpatient Visits           5 months ago Essential hypertension   [place] [facility] at [facility], Jesse Sans, MD   11 months ago Annual physical exam   [place] [facility] at [facility], Jesse Sans, MD   1 year ago Mixed hyperlipidemia   [place] [facility] at [facility], Jesse Sans, MD   1 year ago Annual physical exam   [place] [facility] at [facility], Jesse Sans, MD   2 years ago Essential hypertension   [place] [facility] at [facility], Jesse Sans, MD       Future Appointments             In 1 week Army Melia Jesse Sans, MD [facility] Primary Care and Sports  Medicine at [facility], [facility]

## 2022-03-28 ENCOUNTER — Ambulatory Visit: Payer: Medicare HMO

## 2022-04-02 ENCOUNTER — Ambulatory Visit
Admission: RE | Admit: 2022-04-02 | Discharge: 2022-04-02 | Disposition: A | Discharge status: Home / Self Care | Payer: Medicare HMO | Source: Ambulatory Visit | Attending: Internal Medicine | Admitting: Internal Medicine

## 2022-04-02 DIAGNOSIS — Z1231 Encounter for screening mammogram for malignant neoplasm of breast: Secondary | ICD-10-CM | POA: Diagnosis not present

## 2022-04-04 ENCOUNTER — Encounter: Payer: Self-pay | Admitting: Internal Medicine

## 2022-04-04 ENCOUNTER — Ambulatory Visit (INDEPENDENT_AMBULATORY_CARE_PROVIDER_SITE_OTHER): Payer: Medicare HMO | Admitting: Internal Medicine

## 2022-04-04 VITALS — BP 116/74 | HR 66 | Ht 64.0 in | Wt 133.0 lb

## 2022-04-04 DIAGNOSIS — K219 Gastro-esophageal reflux disease without esophagitis: Secondary | ICD-10-CM | POA: Diagnosis not present

## 2022-04-04 DIAGNOSIS — F17200 Nicotine dependence, unspecified, uncomplicated: Secondary | ICD-10-CM | POA: Diagnosis not present

## 2022-04-04 DIAGNOSIS — I10 Essential (primary) hypertension: Secondary | ICD-10-CM

## 2022-04-04 DIAGNOSIS — Z0000 Encounter for general adult medical examination without abnormal findings: Secondary | ICD-10-CM | POA: Diagnosis not present

## 2022-04-04 DIAGNOSIS — E782 Mixed hyperlipidemia: Secondary | ICD-10-CM

## 2022-04-04 DIAGNOSIS — Z23 Encounter for immunization: Secondary | ICD-10-CM | POA: Diagnosis not present

## 2022-04-04 LAB — POCT URINALYSIS DIPSTICK
Bilirubin, UA: NEGATIVE
Blood, UA: NEGATIVE
Glucose, UA: NEGATIVE
Ketones, UA: NEGATIVE
Leukocytes, UA: NEGATIVE
Nitrite, UA: NEGATIVE
Protein, UA: NEGATIVE
Spec Grav, UA: >=1.03 — AB (ref 1.010–1.025)
Urobilinogen, UA: 0.2 E.U./dL
pH, UA: 5 (ref 5.0–8.0)

## 2022-04-04 MED ORDER — AMLODIPINE BESYLATE 5 MG PO TABS: ORAL_TABLET | ORAL | 1 refills | Status: DC

## 2022-04-04 MED ORDER — LISINOPRIL 10 MG PO TABS: ORAL_TABLET | ORAL | 1 refills | Status: DC

## 2022-04-04 NOTE — Progress Notes (Signed)
Date:  04/04/2022   Name:  Angela Herrera   DOB:  03/30/1951   MRN:  [id number]   Chief Complaint: Annual Exam (Breast exam no pap ) Angela Herrera is a 71 y.o. female who presents today for her Complete Annual Exam. She feels well. She reports exercising walking and standard bike. She reports she is sleeping fairly well. Breast complaints none. She has been sad recently - had to put her dog down.  Mammogram: 03/2022 DEXA: none Pap smear: discontinued Colonoscopy: 05/2017 repeat 10 yrs  Health Maintenance Due  Topic Date Due   DTaP/Tdap/Td (1 - Tdap) Never done   COVID-19 Vaccine (5 - 2023-24 season) 12/22/2021   Medicare Annual Wellness (AWV)  03/22/2022   Lung Cancer Screening  04/25/2022    Immunization History  Administered Date(s) Administered   Moderna Covid-19 Vaccine Bivalent Booster 42yr & up 01/24/2021   Moderna Sars-Covid-2 Vaccination 05/29/2019, 06/26/2019, 02/26/2020   Pneumococcal Conjugate-13 02/28/2016   Pneumococcal Polysaccharide-23 09/24/2012, 02/27/2018   Zoster Recombinat (Shingrix) 03/27/2019, 05/28/2019   Zoster, Live 12/08/2013    Hypertension This is a chronic problem. The problem is controlled. Pertinent negatives include no chest pain, headaches, palpitations or shortness of breath. Past treatments include ACE inhibitors and calcium channel blockers. The current treatment provides significant improvement. There is no history of kidney disease, CAD/MI or CVA.  Hyperlipidemia This is a chronic problem. The problem is controlled. Pertinent negatives include no chest pain or shortness of breath. Current antihyperlipidemic treatment includes statins. The current treatment provides moderate improvement of lipids.  Gastroesophageal Reflux She complains of heartburn. She reports no abdominal pain, no chest pain, no coughing or no wheezing. This is a recurrent problem. The problem occurs occasionally. Pertinent negatives include no fatigue. She has tried  a PPI for the symptoms. The treatment provided significant relief.  Tobacco use - continues to smoke about 3/4 ppd.  She is scheduled for LDCT screening.  Lab Results  Component Value Date   NA 141 04/03/2021   K 4.5 04/03/2021   CO2 25 04/03/2021   GLUCOSE 86 04/03/2021   BUN 10 04/03/2021   CREATININE 0.71 04/03/2021   CALCIUM 9.7 04/03/2021   EGFR 91 04/03/2021   GFRNONAA 89 03/29/2020   Lab Results  Component Value Date   CHOL 165 04/03/2021   HDL 43 04/03/2021   LDLCALC 94 04/03/2021   TRIG 158 (H) 04/03/2021   CHOLHDL 3.8 04/03/2021   Lab Results  Component Value Date   TSH 2.360 04/03/2021   Lab Results  Component Value Date   HGBA1C 5.5 04/03/2021   Lab Results  Component Value Date   WBC 5.9 04/03/2021   HGB 14.1 04/03/2021   HCT 42.8 04/03/2021   MCV 86 04/03/2021   PLT 430 04/03/2021   Lab Results  Component Value Date   ALT 10 04/03/2021   AST 15 04/03/2021   ALKPHOS 74 04/03/2021   BILITOT 0.3 04/03/2021   No results found for: "25OHVITD2", "25OHVITD3", "VD25OH"   Review of Systems  Constitutional:  Negative for chills, fatigue and fever.  HENT:  Negative for congestion, hearing loss, tinnitus, trouble swallowing and voice change.   Eyes:  Negative for visual disturbance.  Respiratory:  Negative for cough, chest tightness, shortness of breath and wheezing.   Cardiovascular:  Negative for chest pain, palpitations and leg swelling.  Gastrointestinal:  Positive for heartburn. Negative for abdominal pain, constipation, diarrhea and vomiting.  Endocrine: Negative for polydipsia and polyuria.  Genitourinary:  Negative for dysuria, frequency, genital sores, vaginal bleeding and vaginal discharge.  Musculoskeletal:  Negative for arthralgias, gait problem and joint swelling.  Skin:  Negative for color change and rash.  Neurological:  Negative for dizziness, tremors, light-headedness and headaches.  Hematological:  Negative for adenopathy. Does not  bruise/bleed easily.  Psychiatric/Behavioral:  Negative for dysphoric mood and sleep disturbance. The patient is not nervous/anxious.     Patient Active Problem List   Diagnosis Date Noted   Aortic atherosclerosis ([place]) 09/25/2018   Hemangioma of liver 03/24/2018   GERD without esophagitis 09/14/2015   Peripheral vascular disease ([place]) 02/23/2015   Tobacco use disorder 02/23/2015   Mixed hyperlipidemia 10/22/2014   Environmental and seasonal allergies 10/22/2014   Essential hypertension 10/22/2014    Allergies  Allergen Reactions   Tetracycline Itching    Past Surgical History:  Procedure Laterality Date   COLONOSCOPY  08/05/2006   normal   COLONOSCOPY WITH PROPOFOL N/A 06/12/2017   Procedure: COLONOSCOPY WITH PROPOFOL;  Surgeon: Lin Landsman, MD;  Location: [facility];  Service: Endoscopy;  Laterality: N/A;   TUBAL LIGATION Bilateral     Social History   Tobacco Use   Smoking status: Every Day    Packs/day: 1.00    Years: 52.00    Total pack years: 52.00    Types: Cigarettes    Start date: 1965   Smokeless tobacco: Never   Tobacco comments:    since age 30; quit 02/2019 after dental work; started smoking again 11/2019; smoking .75 PPD 03/22/21  Vaping Use   Vaping Use: Never used  Substance Use Topics   Alcohol use: Yes    Alcohol/week: 4.0 standard drinks of alcohol    Types: 4 Shots of liquor per week   Drug use: No     Medication list has been reviewed and updated.  Current Meds  Medication Sig   aspirin 81 MG tablet Take 81 mg by mouth daily.   cholecalciferol (VITAMIN D3) 25 MCG (1000 UT) tablet Take 1,000 Units by mouth daily.   Multiple Vitamins-Minerals (MULTIVITAMIN WITH MINERALS) tablet Take 1 tablet daily by mouth.   omeprazole (PRILOSEC) 40 MG capsule TAKE 1 CAPSULE BY MOUTH DAILY   rosuvastatin (CRESTOR) 10 MG tablet TAKE 1 TABLET(10 MG) BY MOUTH DAILY   [DISCONTINUED] amLODipine (NORVASC) 5 MG tablet TAKE 1 TABLET(5 MG) BY  MOUTH DAILY   [DISCONTINUED] lisinopril (ZESTRIL) 10 MG tablet TAKE 1 TABLET(10 MG) BY MOUTH DAILY       04/04/2022    8:38 AM 10/02/2021    8:15 AM 04/03/2021    8:31 AM 09/05/2020    8:26 AM  GAD 7 : Generalized Anxiety Score  Nervous, Anxious, on Edge 0 1 0 0  Control/stop worrying 0 0 0 0  Worry too much - different things 0 1 1 0  Trouble relaxing 0 _0 Restless _1 0  Easily annoyed or irritable _2 0  Afraid - awful might happen 0 0 0 0  Total GAD 7 Score _3 Anxiety Difficulty Not difficult at all Not difficult at all  Not difficult at all       04/04/2022    8:37 AM 10/02/2021    8:15 AM 04/03/2021    8:31 AM  Depression screen PHQ 2/9  Decreased Interest 1 0 1  Down, Depressed, Hopeless _4 PHQ - 2 Score _5 Altered sleeping 1 1 2  Tired, decreased energy _0 Change in appetite _1 Feeling bad or failure about yourself  0 0 0  Trouble concentrating 0 0 2  Moving slowly or fidgety/restless 0 0 0  Suicidal thoughts 0 0 0  PHQ-9 Score _2 Difficult doing work/chores Not difficult at all Not difficult at all Not difficult at all    BP Readings from Last 3 Encounters:  04/04/22 116/74  10/02/21 124/60  04/03/21 118/76    Physical Exam Vitals and nursing note reviewed.  Constitutional:      General: She is not in acute distress.    Appearance: She is well-developed.  HENT:     Head: Normocephalic and atraumatic.     Right Ear: Tympanic membrane and ear canal normal.     Left Ear: Tympanic membrane and ear canal normal.     Nose:     Right Sinus: No maxillary sinus tenderness.     Left Sinus: No maxillary sinus tenderness.  Eyes:     General: No scleral icterus.       Right eye: No discharge.        Left eye: No discharge.     Conjunctiva/sclera: Conjunctivae normal.  Neck:     Thyroid: No thyromegaly.     Vascular: No carotid bruit.  Cardiovascular:     Rate and Rhythm: Normal rate and regular rhythm.     Pulses: Normal  pulses.     Heart sounds: Normal heart sounds.  Pulmonary:     Effort: Pulmonary effort is normal. No respiratory distress.     Breath sounds: No wheezing.  Abdominal:     General: Bowel sounds are normal.     Palpations: Abdomen is soft.     Tenderness: There is no abdominal tenderness.  Musculoskeletal:     Cervical back: Normal range of motion. No erythema.     Right lower leg: No edema.     Left lower leg: No edema.  Lymphadenopathy:     Cervical: No cervical adenopathy.  Skin:    General: Skin is warm and dry.     Findings: No rash.  Neurological:     Mental Status: She is alert and oriented to person, place, and time.     Cranial Nerves: No cranial nerve deficit.     Sensory: No sensory deficit.     Deep Tendon Reflexes: Reflexes are normal and symmetric.  Psychiatric:        Attention and Perception: Attention normal.        Mood and Affect: Mood normal.     Wt Readings from Last 3 Encounters:  04/04/22 133 lb (60.3 kg)  10/02/21 131 lb 3.2 oz (59.5 kg)  04/03/21 132 lb (59.9 kg)    BP 116/74   Pulse 66   Ht _3  (1.626 m)   Wt 133 lb (60.3 kg)   SpO2 98%   BMI 22.83 kg/m   Assessment and Plan: 1. Annual physical exam Normal exam Continue exercise, healthy diet.  2. Essential hypertension Clinically stable exam with well controlled BP. Tolerating medications without side effects at this time. Pt to continue current regimen and low sodium diet; benefits of regular exercise as able discussed. - CBC with Differential/Platelet - Comprehensive metabolic panel - TSH - POCT urinalysis dipstick - amLODipine (NORVASC) 5 MG tablet; TAKE 1 TABLET(5 MG) BY MOUTH DAILY  Dispense: 90 tablet; Refill: 1 - lisinopril (ZESTRIL) 10 MG tablet; TAKE 1 TABLET(10 MG)  BY MOUTH DAILY  Dispense: 90 tablet; Refill: 1  3. GERD without esophagitis Symptoms well controlled on daily PPI No red flag signs such as weight loss, n/v, melena Will continue omeprazole. - CBC with  Differential/Platelet  4. Mixed hyperlipidemia Tolerating statin medication without side effects at this time LDL is at goal of < 70 on current dose Continue same therapy without change at this time. - Lipid panel  5. Tobacco use disorder She continues to smoke but is trying to cut back - currently about 12 per day. LDCT is scheduled.  6. Need for vaccination for pneumococcus   Partially dictated using Editor, commissioning. Any errors are unintentional.  Halina Maidens, MD [facility]  04/04/2022

## 2022-04-05 LAB — COMPREHENSIVE METABOLIC PANEL
ALT: 9 IU/L (ref 0–32)
AST: 15 IU/L (ref 0–40)
Albumin/Globulin Ratio: 1.4 (ref 1.2–2.2)
Albumin: 4.3 g/dL (ref 3.8–4.8)
Alkaline Phosphatase: 76 IU/L (ref 44–121)
BUN/Creatinine Ratio: 13 (ref 12–28)
BUN: 9 mg/dL (ref 8–27)
Bilirubin Total: 0.3 mg/dL (ref 0.0–1.2)
CO2: 24 mmol/L (ref 20–29)
Calcium: 9.9 mg/dL (ref 8.7–10.3)
Chloride: 105 mmol/L (ref 96–106)
Creatinine, Ser: 0.72 mg/dL (ref 0.57–1.00)
Globulin, Total: 3 g/dL (ref 1.5–4.5)
Glucose: 89 mg/dL (ref 70–99)
Potassium: 5 mmol/L (ref 3.5–5.2)
Sodium: 145 mmol/L — ABNORMAL HIGH (ref 134–144)
Total Protein: 7.3 g/dL (ref 6.0–8.5)
eGFR: 89 mL/min/{1.73_m2} (ref >59)

## 2022-04-05 LAB — CBC WITH DIFFERENTIAL/PLATELET
Basophils Absolute: 0.1 10*3/uL (ref 0.0–0.2)
Basos: 1 %
EOS (ABSOLUTE): 0.4 10*3/uL (ref 0.0–0.4)
Eos: 6 %
Hematocrit: 41 % (ref 34.0–46.6)
Hemoglobin: 13.6 g/dL (ref 11.1–15.9)
Immature Grans (Abs): 0 10*3/uL (ref 0.0–0.1)
Immature Granulocytes: 0 %
Lymphocytes Absolute: 2.7 10*3/uL (ref 0.7–3.1)
Lymphs: 40 %
MCH: 28.9 pg (ref 26.6–33.0)
MCHC: 33.2 g/dL (ref 31.5–35.7)
MCV: 87 fL (ref 79–97)
Monocytes Absolute: 0.7 10*3/uL (ref 0.1–0.9)
Monocytes: 10 %
Neutrophils Absolute: 2.9 10*3/uL (ref 1.4–7.0)
Neutrophils: 43 %
Platelets: 472 10*3/uL — ABNORMAL HIGH (ref 150–450)
RBC: 4.7 x10E6/uL (ref 3.77–5.28)
RDW: 13.5 % (ref 11.7–15.4)
WBC: 6.7 10*3/uL (ref 3.4–10.8)

## 2022-04-05 LAB — LIPID PANEL
Chol/HDL Ratio: 3.5 ratio (ref 0.0–4.4)
Cholesterol, Total: 171 mg/dL (ref 100–199)
HDL: 49 mg/dL (ref >39)
LDL Chol Calc (NIH): 102 mg/dL — ABNORMAL HIGH (ref 0–99)
Triglycerides: 112 mg/dL (ref 0–149)
VLDL Cholesterol Cal: 20 mg/dL (ref 5–40)

## 2022-04-05 LAB — TSH: TSH: 1.6 u[IU]/mL (ref 0.450–4.500)

## 2022-04-18 ENCOUNTER — Telehealth: Payer: Self-pay | Admitting: Internal Medicine

## 2022-04-18 NOTE — Telephone Encounter (Signed)
Reached out to [place] in referrals. She is working on the authorization.  KP

## 2022-04-18 NOTE — Telephone Encounter (Signed)
Copied from [place] [id number]. Topic: General - Other >> Apr 18, 2022  2:23 PM Everette C wrote: Reason for CRM: Angela Herrera with the [facility] has called regarding the patient's upcoming chest  CT / lung cancer screening   Authorization is needed for the upcoming procedure scheduled on 04/25/22  Please contact [place] further when possible regarding the authorization

## 2022-04-25 ENCOUNTER — Telehealth: Payer: Self-pay | Admitting: Internal Medicine

## 2022-04-25 ENCOUNTER — Ambulatory Visit
Admission: RE | Admit: 2022-04-25 | Discharge: 2022-04-25 | Disposition: A | Discharge status: Home / Self Care | Payer: Medicare HMO | Source: Ambulatory Visit | Attending: Internal Medicine | Admitting: Internal Medicine

## 2022-04-25 DIAGNOSIS — Z87891 Personal history of nicotine dependence: Secondary | ICD-10-CM | POA: Diagnosis present

## 2022-04-25 DIAGNOSIS — F17210 Nicotine dependence, cigarettes, uncomplicated: Secondary | ICD-10-CM | POA: Diagnosis present

## 2022-04-25 NOTE — Telephone Encounter (Signed)
Spoke with patient she req CB 05/24/2022 husband in hospital

## 2022-04-27 ENCOUNTER — Other Ambulatory Visit: Payer: Self-pay

## 2022-04-27 DIAGNOSIS — Z87891 Personal history of nicotine dependence: Secondary | ICD-10-CM

## 2022-04-27 DIAGNOSIS — F17210 Nicotine dependence, cigarettes, uncomplicated: Secondary | ICD-10-CM

## 2022-05-03 ENCOUNTER — Telehealth: Payer: Self-pay | Admitting: Internal Medicine

## 2022-05-03 NOTE — Telephone Encounter (Signed)
Copied from [place]. Topic: Medicare AWV >> May 03, 2022  9:38 AM Devoria Glassing wrote: Reason for CRM: Left message tor patient to schedule Medicare Annual Wellness Visit (AWV) with [facility] Health Advisor.  Appointment can be an offiice/telephone or virtual visit;  Please call [phone] ask for [facility].

## 2022-05-14 ENCOUNTER — Ambulatory Visit (INDEPENDENT_AMBULATORY_CARE_PROVIDER_SITE_OTHER): Payer: Medicare HMO

## 2022-05-14 VITALS — BP 132/76 | HR 71

## 2022-05-14 DIAGNOSIS — Z0000 Encounter for general adult medical examination without abnormal findings: Secondary | ICD-10-CM | POA: Diagnosis not present

## 2022-05-14 NOTE — Progress Notes (Signed)
I connected with  Angela Herrera on 05/14/22 by a audio enabled telemedicine application and verified that I am speaking with the correct person using two identifiers.  Patient Location: Home  Provider Location: Office/Clinic  I discussed the limitations of evaluation and management by telemedicine. The patient expressed understanding and agreed to proceed.  Subjective:   Angela Herrera is a 73 y.o. female who presents for Medicare Annual (Subsequent) preventive examination.  Review of Systems    Per HPI unless specifically indicated below.  Cardiac Risk Factors include: advanced age (>65mn, >>63women);female gender, Essential Hypertension, and Mixed hyperlipidemia           Objective:       05/14/2022    9:01 AM 04/04/2022    8:30 AM 10/02/2021    8:10 AM  Vitals with BMI  Height  '5\' 4"'$  '5\' 4"'$   Weight  133 lbs 131 lbs 3 oz  BMI  297.98292.11 Systolic [id number] Diastolic 76 74 60  Pulse 71 66 72    Today's Vitals   05/14/22 0901 05/14/22 0907  BP: 132/76   Pulse: 71   PainSc:  5    There is no height or weight on file to calculate BMI.     05/14/2022    9:13 AM 03/22/2021    8:49 AM 03/21/2020    8:52 AM 03/16/2019    8:56 AM 03/10/2018    8:36 AM 02/27/2018    8:56 AM 06/12/2017    8:41 AM  Advanced Directives  Does Patient Have a Medical Advance Directive? No No Yes No Yes No Yes  Type of Advance Directive   Living will  [place]  Living will  Does patient want to make changes to medical advance directive?       No - Patient declined  Copy of [place] Chart?     No - copy requested    Would patient like information on creating a medical advance directive? No - Patient declined Yes (MAU/Ambulatory/Procedural Areas - Information given)  Yes (MAU/Ambulatory/Procedural Areas - Information given)       Current Medications (verified) Outpatient Encounter Medications as of 05/14/2022  Medication Sig    amLODipine (NORVASC) 5 MG tablet TAKE 1 TABLET(5 MG) BY MOUTH DAILY   aspirin 81 MG tablet Take 81 mg by mouth daily.   cholecalciferol (VITAMIN D3) 25 MCG (1000 UT) tablet Take 1,000 Units by mouth daily.   lisinopril (ZESTRIL) 10 MG tablet TAKE 1 TABLET(10 MG) BY MOUTH DAILY   Multiple Vitamins-Minerals (MULTIVITAMIN WITH MINERALS) tablet Take 1 tablet daily by mouth.   omeprazole (PRILOSEC) 40 MG capsule TAKE 1 CAPSULE BY MOUTH DAILY   rosuvastatin (CRESTOR) 10 MG tablet TAKE 1 TABLET(10 MG) BY MOUTH DAILY   No facility-administered encounter medications on file as of 05/14/2022.    Allergies (verified) Tetracycline   History: Past Medical History:  Diagnosis Date   Appetite impaired 09/22/2019   GERD (gastroesophageal reflux disease)    Hyperlipidemia    Hypertension    Peripheral vascular disease ([place]    right leg   Tobacco use disorder 02/23/2015   Past Surgical History:  Procedure Laterality Date   COLONOSCOPY  08/05/2006   normal   COLONOSCOPY WITH PROPOFOL N/A 06/12/2017   Procedure: COLONOSCOPY WITH PROPOFOL;  Surgeon: VLin Landsman MD;  [facility]  Service: Endoscopy;  Laterality: N/A;   TUBAL LIGATION Bilateral  Family History  Problem Relation Age of Onset   Hypertension Mother    Stroke Mother    Breast cancer Neg Hx    Social History   Socioeconomic History   Marital status: Married    Spouse name: [name]   Number of children: 0   Years of education: Not on file   Highest education level: Some college, no degree  Occupational History   Occupation: retired  Tobacco Use   Smoking status: Every Day    Packs/day: 0.50    Years: 52.00    Total pack years: 26.00    Types: Cigarettes    Start date: 1965   Smokeless tobacco: Never   Tobacco comments:    since age 52; quit 02/2019 after dental work; started smoking again 11/2019; smoking .75 PPD 03/22/21  Vaping Use   Vaping Use: Never used  Substance and Sexual  Activity   Alcohol use: Yes    Alcohol/week: 4.0 standard drinks of alcohol    Types: 4 Shots of liquor per week   Drug use: No   Sexual activity: Yes  Other Topics Concern   Not on file  Social History Narrative   Not on file   Social Determinants of Health   Financial Resource Strain: Low Risk  (05/14/2022)   Overall Financial Resource Strain (CARDIA)    Difficulty of Paying Living Expenses: Not hard at all  Food Insecurity: No Food Insecurity (05/14/2022)   Hunger Vital Sign    Worried About Running Out of Food in the Last Year: Never true    Ran Out of Food in the Last Year: Never true  Transportation Needs: No Transportation Needs (05/14/2022)   PRAPARE - Hydrologist (Medical): No    Lack of Transportation (Non-Medical): No  Physical Activity: Insufficiently Active (05/14/2022)   Exercise Vital Sign    Days of Exercise per Week: 2 days    Minutes of Exercise per Session: 10 min  Stress: No Stress Concern Present (05/14/2022)   [place]    Feeling of Stress : Not at all  Social Connections: Moderately Isolated (05/14/2022)   Social Connection and Isolation Panel [NHANES]    Frequency of Communication with Friends and Family: More than three times a week    Frequency of Social Gatherings with Friends and Family: Once a week    Attends Religious Services: Never    Marine scientist or Organizations: No    Attends Archivist Meetings: Never    Marital Status: Married    Tobacco Counseling Ready to quit: No Counseling given: No Tobacco comments: since age 72; quit 02/2019 after dental work; started smoking again 11/2019; smoking .75 PPD 03/22/21   Clinical Intake:  Pre-visit preparation completed: No  Pain : 0-10 Pain Score: 5  Pain Type: Acute pain Pain Location: Hip Pain Orientation: Left Pain Descriptors / Indicators: Aching     Nutritional Status:  BMI of 19-24  Normal Nutritional Risks: None Diabetes: No  How often do you need to have someone help you when you read instructions, pamphlets, or other written materials from your doctor or pharmacy?: 1 - Never  Diabetic?No   Interpreter Needed?: No  Information entered by :: Donnie Mesa, CMA   Activities of Daily Living    05/14/2022    9:05 AM  In your present state of health, do you have any difficulty performing the following activities:  Hearing? 0  Vision? 1  Comment wear glasses, [facility]  Difficulty concentrating or making decisions? 0  Walking or climbing stairs? 0  Dressing or bathing? 0  Doing errands, shopping? 0    Patient Care Team: Glean Hess, MD as PCP - General (Internal Medicine)  Indicate any recent Medical Services you may have received from other than [facility] providers in the past year (date may be approximate).     Assessment:   This is a routine wellness examination for Angela Herrera.  Hearing/Vision screen Denies any hearing issues. Denies any changes to her vision, wear glasses. Annual Eye Exam, [facility]   Dietary issues and exercise activities discussed: Current Exercise Habits: Structured exercise class, Type of exercise: Other - see comments (stationary bike), Time (Minutes): 10, Frequency (Times/Week): 2, Weekly Exercise (Minutes/Week): 20, Intensity: Mild, Exercise limited by: None identified The pt state she doesn't exercise as much since her dog passed away.   Goals Addressed   None    Depression Screen    05/14/2022    9:05 AM 04/04/2022    8:37 AM 10/02/2021    8:15 AM 04/03/2021    8:31 AM 03/22/2021    8:49 AM 09/05/2020    8:26 AM 03/29/2020    8:08 AM  PHQ 2/9 Scores  PHQ - 2 Score 0 '2 1 2 '$ 0 3 3  PHQ- 9 Score  '5 4 8  9 6    '$ Fall Risk    05/14/2022    9:05 AM 04/04/2022    8:38 AM 10/02/2021    8:15 AM 04/03/2021    8:32 AM 03/22/2021    8:52 AM  Fall Risk   Falls in the past year? 0 0 0 1 0   Number falls in past yr: 0 0 0 0 0  Injury with Fall? 0 0 0 1 0  Risk for fall due to : No Fall Risks No Fall Risks No Fall Risks History of fall(s) No Fall Risks  Follow up Falls evaluation completed Falls evaluation completed Falls evaluation completed Falls evaluation completed Falls prevention discussed    FALL RISK PREVENTION PERTAINING TO THE HOME:  Any stairs in or around the home? No  If so, are there any without handrails? No  Home free of loose throw rugs in walkways, pet beds, electrical cords, etc? Yes  Adequate lighting in your home to reduce risk of falls? Yes   ASSISTIVE DEVICES UTILIZED TO PREVENT FALLS:  Life alert? No  Use of a cane, walker or w/c? No  Grab bars in the bathroom? No  Shower chair or bench in shower? Yes  Elevated toilet seat or a handicapped toilet? Yes   TIMED UP AND GO:  Was the test performed?  unable to perform, virtual appt  .  Cognitive Function:        05/14/2022    9:06 AM 03/21/2020    8:54 AM 03/10/2018    8:48 AM 02/27/2018    8:57 AM  6CIT Screen  What Year? 0 points 0 points 0 points 0 points  What month? 0 points 0 points 0 points 0 points  What time? 0 points 0 points 0 points 0 points  Count back from 20 0 points 0 points 0 points 0 points  Months in reverse 0 points 0 points 0 points 0 points  Repeat phrase 0 points 0 points 0 points 0 points  Total Score 0 points 0 points 0 points 0 points    Immunizations Immunization History  Administered Date(s) Administered   Moderna Covid-19 Vaccine Bivalent Booster 17yr & up 01/24/2021   Moderna Sars-Covid-2 Vaccination 05/29/2019, 06/26/2019, 02/26/2020   PNEUMOCOCCAL CONJUGATE-20 04/04/2022   Pneumococcal Conjugate-13 02/28/2016   Pneumococcal Polysaccharide-23 09/24/2012, 02/27/2018   Zoster Recombinat (Shingrix) 03/27/2019, 05/28/2019   Zoster, Live 12/08/2013    TDAP status: Due, Education has been provided regarding the importance of this vaccine. Advised may  receive this vaccine at local pharmacy or Health Dept. Aware to provide a copy of the vaccination record if obtained from local pharmacy or Health Dept. Verbalized acceptance and understanding.  Flu Vaccine status: Declined, Education has been provided regarding the importance of this vaccine but patient still declined. Advised may receive this vaccine at local pharmacy or Health Dept. Aware to provide a copy of the vaccination record if obtained from local pharmacy or Health Dept. Verbalized acceptance and understanding.  Pneumococcal vaccine status: Up to date  Covid-19 vaccine status: Information provided on how to obtain vaccines.   Qualifies for Shingles Vaccine? Yes   Zostavax completed Yes   Shingrix Completed?: Yes  Screening Tests Health Maintenance  Topic Date Due   DTaP/Tdap/Td (1 - Tdap) Never done   COVID-19 Vaccine (5 - 2023-24 season) 12/22/2021   INFLUENZA VACCINE  07/22/2022 (Originally 11/21/2021)   MAMMOGRAM  04/03/2023   Lung Cancer Screening  04/26/2023   Medicare Annual Wellness (AWV)  05/15/2023   COLONOSCOPY (Pts 45-456yrInsurance coverage will need to be confirmed)  06/13/2027   Pneumonia Vaccine 6557Years old  Completed   DEXA SCAN  Completed   Hepatitis C Screening  Completed   Zoster Vaccines- Shingrix  Completed   HPV VACCINES  Aged Out    Health Maintenance  Health Maintenance Due  Topic Date Due   DTaP/Tdap/Td (1 - Tdap) Never done   COVID-19 Vaccine (5 - 2023-24 season) 12/22/2021    Colorectal cancer screening: Type of screening: Colonoscopy. Completed 06/12/2017. Repeat every 10 years  Mammogram status: Completed 04/02/22. Repeat every year  DEXA scan: 07/09/2006  Lung Cancer Screening: (Low Dose CT Chest recommended if Age 73-80ears, 30 pack-year currently smoking OR have quit w/in 15years.) does qualify.   Lung Cancer Screening Referral: ordered placed 04/27/2022  Additional Screening:  Hepatitis C Screening: does qualify;  Completed 02/28/2016  Vision Screening: Recommended annual ophthalmology exams for early detection of glaucoma and other disorders of the eye. Is the patient up to date with their annual eye exam?  Yes  Who is the provider or what is the name of the office in which the patient attends annual eye exams[facility] a provider, would they like to be referred to a provider to establish care? No .   Dental Screening: Recommended annual dental exams for proper oral hygiene  Community Resource Referral / Chronic Care Management: CRR required this visit?  No   CCM required this visit?  No      Plan:     I have personally reviewed and noted the following in the patient's chart:   Medical and social history Use of alcohol, tobacco or illicit drugs  Current medications and supplements including opioid prescriptions. Patient is not currently taking opioid prescriptions. Functional ability and status Nutritional status Physical activity Advanced directives List of other physicians Hospitalizations, surgeries, and ER visits in previous 12 months Vitals Screenings to include cognitive, depression, and falls Referrals and appointments  In addition, I have reviewed and discussed with patient certain preventive  protocols, quality metrics, and best practice recommendations. A written personalized care plan for preventive services as well as general preventive health recommendations were provided to patient.    Angela Herrera , Thank you for taking time to come for your Medicare Wellness Visit. I appreciate your ongoing commitment to your health goals. Please review the following plan we discussed and let me know if I can assist you in the future.   These are the goals we discussed:  Goals      Quit Smoking     Pt would like to quit smoking. Smoking cessation materials given and information provided about classes and assistance.         This is a list  of the screening recommended for you and due dates:  Health Maintenance  Topic Date Due   DTaP/Tdap/Td vaccine (1 - Tdap) Never done   COVID-19 Vaccine (5 - 2023-24 season) 12/22/2021   Flu Shot  07/22/2022*   Mammogram  04/03/2023   Screening for Lung Cancer  04/26/2023   Medicare Annual Wellness Visit  05/15/2023   Colon Cancer Screening  06/13/2027   Pneumonia Vaccine  Completed   DEXA scan (bone density measurement)  Completed   Hepatitis C Screening: USPSTF Recommendation to screen - Ages 86-79 yo.  Completed   Zoster (Shingles) Vaccine  Completed   HPV Vaccine  Aged Out  *Topic was postponed. The date shown is not the original due date.      Wilson Singer, [place]   05/14/2022   Nurse Notes: Approximately 30 minute Non-Face -To-Face Medicare Wellness Visit

## 2022-05-14 NOTE — Patient Instructions (Signed)

## 2022-08-20 ENCOUNTER — Other Ambulatory Visit: Payer: Self-pay | Admitting: Internal Medicine

## 2022-08-20 DIAGNOSIS — I10 Essential (primary) hypertension: Secondary | ICD-10-CM

## 2022-08-20 NOTE — Telephone Encounter (Unsigned)
Copied from CRM [id number]. Topic: General - Other >> Aug 20, 2022  9:13 AM Everette C wrote: Reason for CRM: Medication Refill - Medication: lisinopril (ZESTRIL) 10 MG tablet [[id number]]  Has the patient contacted their pharmacy? Yes.   (Agent: If no, request that the patient contact the pharmacy for the refill. If patient does not wish to contact the pharmacy document the reason why and proceed with request.) (Agent: If yes, when and what did the pharmacy advise?)  Preferred Pharmacy (with phone number or street name): [phone] Dan [facility]re[facility], [place] - [address] [place] [address] Phone: [phone] Fax: [phone] Hours: Not open 24 hours   Has the patient been seen for an appointment in the last year OR does the patient have an upcoming appointment? Yes.    Agent: Please be advised that RX refills may take up to 3 business days. We ask that you follow-up with your pharmacy.

## 2022-08-21 MED ORDER — LISINOPRIL 10 MG PO TABS: ORAL_TABLET | ORAL | 0 refills | Status: DC

## 2022-08-21 NOTE — Telephone Encounter (Signed)
Future visit in 1 month.  Requested Prescriptions  Pending Prescriptions Disp Refills   lisinopril (ZESTRIL) 10 MG tablet 90 tablet 0    Sig: TAKE 1 TABLET(10 MG) BY MOUTH DAILY     Cardiovascular:  ACE Inhibitors Passed - 08/20/2022  9:53 AM      Passed - Cr in normal range and within 180 days    Creatinine, Ser  Date Value Ref Range Status  04/04/2022 0.72 0.57 - 1.00 mg/dL Final         Passed - K in normal range and within 180 days    Potassium  Date Value Ref Range Status  04/04/2022 5.0 3.5 - 5.2 mmol/L Final         Passed - Patient is not pregnant      Passed - Last BP in normal range    BP Readings from Last 1 Encounters:  05/14/22 132/76         Passed - Valid encounter within last 6 months    Recent Outpatient Visits           4 months ago Annual physical exam   [place] [facility] at [facility], Nyoka Cowden, MD   10 months ago Essential hypertension   [place] [facility] at [facility], Nyoka Cowden, MD   1 year ago Annual physical exam   [facility] Primary Care & Sports Medicine at [facility], Nyoka Cowden, MD   1 year ago Mixed hyperlipidemia   [place] [facility] at [facility], Nyoka Cowden, MD   2 years ago Annual physical exam   [facility] Health Primary Care & Sports Medicine at [facility], Nyoka Cowden, MD       Future Appointments             In 1 month Judithann Graves, Nyoka Cowden, MD [facility] Health Primary Care & Sports Medicine at [facility], [facility]   In 7 months Judithann Graves, Nyoka Cowden, MD [facility] Health Primary Care & Sports Medicine at [facility], [facility]

## 2022-09-04 ENCOUNTER — Other Ambulatory Visit: Payer: Self-pay | Admitting: Internal Medicine

## 2022-09-04 DIAGNOSIS — K219 Gastro-esophageal reflux disease without esophagitis: Secondary | ICD-10-CM

## 2022-09-04 DIAGNOSIS — I10 Essential (primary) hypertension: Secondary | ICD-10-CM

## 2022-09-04 DIAGNOSIS — E782 Mixed hyperlipidemia: Secondary | ICD-10-CM

## 2022-09-04 MED ORDER — OMEPRAZOLE 40 MG PO CPDR: DELAYED_RELEASE_CAPSULE | ORAL | 1 refills | Status: DC

## 2022-09-04 MED ORDER — ROSUVASTATIN CALCIUM 10 MG PO TABS: ORAL_TABLET | ORAL | 1 refills | Status: DC

## 2022-09-04 MED ORDER — AMLODIPINE BESYLATE 5 MG PO TABS: ORAL_TABLET | ORAL | 0 refills | Status: DC

## 2022-09-04 NOTE — Telephone Encounter (Signed)
Requested Prescriptions  Pending Prescriptions Disp Refills   omeprazole (PRILOSEC) 40 MG capsule 90 capsule 1    Sig: TAKE 1 CAPSULE BY MOUTH DAILY     Gastroenterology: Proton Pump Inhibitors Passed - 09/04/2022 11:14 AM      Passed - Valid encounter within last 12 months    Recent Outpatient Visits           5 months ago Annual physical exam   [place] [facility] at [facility], Nyoka Cowden, MD   11 months ago Essential hypertension   [place] [facility] at [facility], Nyoka Cowden, MD   1 year ago Annual physical exam   [facility] & Sports Medicine at [facility], Nyoka Cowden, MD   1 year ago Mixed hyperlipidemia   [place] [facility] at [facility], Nyoka Cowden, MD   2 years ago Annual physical exam   [facility] & [facility] Medicine at [facility], Nyoka Cowden, MD       Future Appointments             In 1 month Judithann Graves, Nyoka Cowden, MD [facility] Health Primary Care & Sports Medicine at [facility], [facility]   In 7 months Judithann Graves, Nyoka Cowden, MD [facility] Health Primary Care & Sports Medicine at [facility], [facility]

## 2022-09-04 NOTE — Telephone Encounter (Signed)
Medication Refill - Medication:   omeprazole (PRILOSEC) 40 MG capsule     Has the patient contacted their pharmacy? No    (Preferred Pharmacy (with phone number or street name):  [phone] Dan Humphreys, [place] - [address]  [address] [place] [address]  Phone: [phone] Fax: [phone]  Hours: Not open 24 hour         Has the patient been seen for an appointment in the last year OR does the patient have an upcoming appointment? Yes.    Agent: Please be advised that RX refills may take up to 3 business days. We ask that you follow-up with your pharmacy.

## 2022-09-04 NOTE — Telephone Encounter (Signed)
Medication Refill - Medication: amLODipine (NORVASC) 5 MG tablet , rosuvastatin (CRESTOR) 10 MG tablet   Has the patient contacted their pharmacy? No.   Preferred Pharmacy (with phone number or street name):  [facility] [phone] Dan Humphreys, [place] - [address] Phone: [phone]  Fax: [phone]      Has the patient been seen for an appointment in the last year OR does the patient have an upcoming appointment? Yes.    Please assist patient further

## 2022-09-04 NOTE — Telephone Encounter (Signed)
Requested Prescriptions  Pending Prescriptions Disp Refills   amLODipine (NORVASC) 5 MG tablet 90 tablet 0    Sig: TAKE 1 TABLET(5 MG) BY MOUTH DAILY     Cardiovascular: Calcium Channel Blockers 2 Passed - 09/04/2022 11:09 AM      Passed - Last BP in normal range    BP Readings from Last 1 Encounters:  05/14/22 132/76         Passed - Last Heart Rate in normal range    Pulse Readings from Last 1 Encounters:  05/14/22 71         Passed - Valid encounter within last 6 months    Recent Outpatient Visits           5 months ago Annual physical exam   [place] [facility] at [facility], Nyoka Cowden, MD   11 months ago Essential hypertension   [place] [facility] at [facility], Nyoka Cowden, MD   1 year ago Annual physical exam   [facility] Health Primary Care & Sports Medicine at [facility], Nyoka Cowden, MD   1 year ago Mixed hyperlipidemia   [place] [facility] at [facility], Nyoka Cowden, MD   2 years ago Annual physical exam   [facility] Primary Care & Sports Medicine at [facility], Nyoka Cowden, MD       Future Appointments             In 1 month Judithann Graves, Nyoka Cowden, MD [facility] Health Primary Care & Sports Medicine at [facility], [facility]   In 7 months Reubin Milan, MD [facility] Health Primary Care & Sports Medicine at [facility], [facility]             rosuvastatin (CRESTOR) 10 MG tablet 90 tablet 1    Sig: TAKE 1 TABLET(10 MG) BY MOUTH DAILY     Cardiovascular:  Antilipid - Statins 2 Failed - 09/04/2022 11:09 AM      Failed - Lipid Panel in normal range within the last 12 months    Cholesterol, Total  Date Value Ref Range Status  04/04/2022 171 100 - 199 mg/dL Final   LDL Chol Calc (NIH)  Date Value Ref Range Status  04/04/2022 102 (H) 0 - 99 mg/dL Final   HDL  Date Value Ref Range Status  04/04/2022 49 >39 mg/dL Final   Triglycerides  Date Value  Ref Range Status  04/04/2022 112 0 - 149 mg/dL Final         Passed - Cr in normal range and within 360 days    Creatinine, Ser  Date Value Ref Range Status  04/04/2022 0.72 0.57 - 1.00 mg/dL Final         Passed - Patient is not pregnant      Passed - Valid encounter within last 12 months    Recent Outpatient Visits           5 months ago Annual physical exam   [place] [facility] at [facility], Nyoka Cowden, MD   11 months ago Essential hypertension   [place] [facility] at [facility], Nyoka Cowden, MD   1 year ago Annual physical exam   [facility] Health Primary Care & Sports Medicine at [facility], Nyoka Cowden, MD   1 year ago Mixed hyperlipidemia   [place] [facility] Health Primary Care & Sports  Medicine at [facility], Nyoka Cowden, MD   2 years ago Annual physical exam   [facility] Health Primary Care & Sports Medicine at [facility], Nyoka Cowden, MD       Future Appointments             In 1 month Judithann Graves, Nyoka Cowden, MD [facility] Health Primary Care & Sports Medicine at [facility], [facility]   In 7 months Judithann Graves, Nyoka Cowden, MD [facility] Health Primary Care & Sports Medicine at [facility], [facility]

## 2022-09-14 ENCOUNTER — Other Ambulatory Visit: Payer: Self-pay | Admitting: Internal Medicine

## 2022-09-14 DIAGNOSIS — E782 Mixed hyperlipidemia: Secondary | ICD-10-CM

## 2022-09-14 DIAGNOSIS — K219 Gastro-esophageal reflux disease without esophagitis: Secondary | ICD-10-CM

## 2022-09-14 NOTE — Telephone Encounter (Signed)
Requested Prescriptions  Refused Prescriptions Disp Refills   omeprazole (PRILOSEC) 40 MG capsule [Pharmacy Med Name: OMEPRAZOLE 40MG  CAPSULES] 90 capsule 1    Sig: TAKE 1 CAPSULE BY MOUTH DAILY     Gastroenterology: Proton Pump Inhibitors Passed - 09/14/2022  6:21 AM      Passed - Valid encounter within last 12 months    Recent Outpatient Visits           5 months ago Annual physical exam   [place] [facility] at [facility], Nyoka Cowden, MD   11 months ago Essential hypertension   [place] [facility] at [facility], Nyoka Cowden, MD   1 year ago Annual physical exam   [facility] at [facility], Nyoka Cowden, MD   2 years ago Mixed hyperlipidemia   [place] [facility] at [facility], Nyoka Cowden, MD   2 years ago Annual physical exam   [facility] at [facility], Nyoka Cowden, MD       Future Appointments             In 2 weeks Judithann Graves Nyoka Cowden, MD [facility] Health Primary Care & Sports Medicine at [facility], [facility]   In 6 months Reubin Milan, MD [facility] Health Primary Care & Sports Medicine at [facility], [facility]             rosuvastatin (CRESTOR) 10 MG tablet [Pharmacy Med Name: ROSUVASTATIN 10MG  TABLETS] 90 tablet 1    Sig: TAKE 1 TABLET(10 MG) BY MOUTH DAILY     Cardiovascular:  Antilipid - Statins 2 Failed - 09/14/2022  6:21 AM      Failed - Lipid Panel in normal range within the last 12 months    Cholesterol, Total  Date Value Ref Range Status  04/04/2022 171 100 - 199 mg/dL Final   LDL Chol Calc (NIH)  Date Value Ref Range Status  04/04/2022 102 (H) 0 - 99 mg/dL Final   HDL  Date Value Ref Range Status  04/04/2022 49 >39 mg/dL Final   Triglycerides  Date Value Ref Range Status  04/04/2022 112 0 - 149 mg/dL Final         Passed - Cr in normal range and within 360 days     Creatinine, Ser  Date Value Ref Range Status  04/04/2022 0.72 0.57 - 1.00 mg/dL Final         Passed - Patient is not pregnant      Passed - Valid encounter within last 12 months    Recent Outpatient Visits           5 months ago Annual physical exam   [place] [facility] at [facility], Nyoka Cowden, MD   11 months ago Essential hypertension   [place] [facility] at [facility], Nyoka Cowden, MD   1 year ago Annual physical exam   [facility] Primary Care & [facility] Medicine at [facility], Nyoka Cowden, MD   2 years ago Mixed hyperlipidemia   [facility] at [facility], Nyoka Cowden, MD   2 years ago Annual physical exam   [facility] at [facility], Nyoka Cowden, MD       Future Appointments  In 2 weeks Judithann Graves, Nyoka Cowden, MD [facility] Health Primary Care & Sports Medicine at [facility], [facility]   In 6 months Judithann Graves, Nyoka Cowden, MD [facility] Health Primary Care & Sports Medicine at [facility], [facility]

## 2022-09-18 ENCOUNTER — Ambulatory Visit (INDEPENDENT_AMBULATORY_CARE_PROVIDER_SITE_OTHER): Payer: Medicare HMO | Admitting: Internal Medicine

## 2022-09-18 ENCOUNTER — Encounter: Payer: Self-pay | Admitting: Internal Medicine

## 2022-09-18 VITALS — BP 90/50 | HR 73 | Ht 64.0 in | Wt 133.0 lb

## 2022-09-18 DIAGNOSIS — L03119 Cellulitis of unspecified part of limb: Secondary | ICD-10-CM

## 2022-09-18 NOTE — Progress Notes (Signed)
Date:  09/18/2022   Name:  Angela Herrera   DOB:  17-Jun-1950   MRN:  [id number]   Chief Complaint: Ankle Pain (Insect bite on right ankle - noticed this first on 09/12/2022. At first it was itching. Patient used alcohol swabs to stop the itching. Was filled with pus, and burst while she was sleeping in the bed. Feels better today, but she is concerning something is still in the bite. )  HPI  Lab Results  Component Value Date   NA 145 (H) 04/04/2022   K 5.0 04/04/2022   CO2 24 04/04/2022   GLUCOSE 89 04/04/2022   BUN 9 04/04/2022   CREATININE 0.72 04/04/2022   CALCIUM 9.9 04/04/2022   EGFR 89 04/04/2022   GFRNONAA 89 03/29/2020   Lab Results  Component Value Date   CHOL 171 04/04/2022   HDL 49 04/04/2022   LDLCALC 102 (H) 04/04/2022   TRIG 112 04/04/2022   CHOLHDL 3.5 04/04/2022   Lab Results  Component Value Date   TSH 1.600 04/04/2022   Lab Results  Component Value Date   HGBA1C 5.5 04/03/2021   Lab Results  Component Value Date   WBC 6.7 04/04/2022   HGB 13.6 04/04/2022   HCT 41.0 04/04/2022   MCV 87 04/04/2022   PLT 472 (H) 04/04/2022   Lab Results  Component Value Date   ALT 9 04/04/2022   AST 15 04/04/2022   ALKPHOS 76 04/04/2022   BILITOT 0.3 04/04/2022   No results found for: "25OHVITD2", "25OHVITD3", "VD25OH"   Review of Systems  Constitutional:  Negative for chills, fatigue and fever.  Respiratory:  Negative for chest tightness and shortness of breath.   Cardiovascular:  Negative for chest pain.  Musculoskeletal:  Negative for arthralgias and gait problem.  Skin:  Positive for wound (inner right ankle).    Patient Active Problem List   Diagnosis Date Noted   Aortic atherosclerosis (HCC) 09/25/2018   Hemangioma of liver 03/24/2018   GERD without esophagitis 09/14/2015   Peripheral vascular disease (HCC) 02/23/2015   Tobacco use disorder 02/23/2015   Mixed hyperlipidemia 10/22/2014   Environmental and seasonal allergies 10/22/2014    Essential hypertension 10/22/2014    Allergies  Allergen Reactions   Tetracycline Itching    Past Surgical History:  Procedure Laterality Date   COLONOSCOPY  08/05/2006   normal   COLONOSCOPY WITH PROPOFOL N/A 06/12/2017   Procedure: COLONOSCOPY WITH PROPOFOL;  Surgeon: Toney Reil, MD;  [facility];  Service: Endoscopy;  Laterality: N/A;   TUBAL LIGATION Bilateral     Social History   Tobacco Use   Smoking status: Every Day    Packs/day: 0.50    Years: 55.00    Additional pack years: 0.00    Total pack years: 27.50    Types: Cigarettes    Start date: 1965   Smokeless tobacco: Never   Tobacco comments:    since age 43; quit 02/2019 after dental work; started smoking again 11/2019; smoking .75 PPD 03/22/21  Vaping Use   Vaping Use: Never used  Substance Use Topics   Alcohol use: Yes    Alcohol/week: 4.0 standard drinks of alcohol    Types: 4 Shots of liquor per week   Drug use: No     Medication list has been reviewed and updated.  Current Meds  Medication Sig   amLODipine (NORVASC) 5 MG tablet TAKE 1 TABLET(5 MG) BY MOUTH DAILY   aspirin 81 MG tablet Take  81 mg by mouth daily.   cholecalciferol (VITAMIN D3) 25 MCG (1000 UT) tablet Take 1,000 Units by mouth daily.   lisinopril (ZESTRIL) 10 MG tablet TAKE 1 TABLET(10 MG) BY MOUTH DAILY   Multiple Vitamins-Minerals (MULTIVITAMIN WITH MINERALS) tablet Take 1 tablet daily by mouth.   omeprazole (PRILOSEC) 40 MG capsule TAKE 1 CAPSULE BY MOUTH DAILY   rosuvastatin (CRESTOR) 10 MG tablet TAKE 1 TABLET(10 MG) BY MOUTH DAILY       09/18/2022    8:52 AM 04/04/2022    8:38 AM 10/02/2021    8:15 AM 04/03/2021    8:31 AM  GAD 7 : Generalized Anxiety Score  Nervous, Anxious, on Edge 0 0 1 0  Control/stop worrying 0 0 0 0  Worry too much - different things 1 0 1 1  Trouble relaxing 1 0 1 2  Restless 1 1 1 1   Easily annoyed or irritable 1 2 1 1   Afraid - awful might happen 1 0 0 0  Total GAD 7  Score 5 3 5 5   Anxiety Difficulty Not difficult at all Not difficult at all Not difficult at all        09/18/2022    8:52 AM 05/14/2022    9:05 AM 04/04/2022    8:37 AM  Depression screen PHQ 2/9  Decreased Interest 0 0 1  Down, Depressed, Hopeless 1 0 1  PHQ - 2 Score 1 0 2  Altered sleeping 2  1  Tired, decreased energy 1  1  Change in appetite 1  1  Feeling bad or failure about yourself  0  0  Trouble concentrating 0  0  Moving slowly or fidgety/restless 0  0  Suicidal thoughts 0  0  PHQ-9 Score 5  5  Difficult doing work/chores Not difficult at all  Not difficult at all    BP Readings from Last 3 Encounters:  09/18/22 (!) 90/50  05/14/22 132/76  04/04/22 116/74    Physical Exam Vitals and nursing note reviewed.  Constitutional:      General: She is not in acute distress.    Appearance: She is well-developed.  HENT:     Head: Normocephalic and atraumatic.  Pulmonary:     Effort: Pulmonary effort is normal. No respiratory distress.  Skin:    General: Skin is warm and dry.     Findings: Rash present. Rash is nodular.     Comments: Dime sized red lesion with central eschar and serous drainage - non tender  Neurological:     Mental Status: She is alert and oriented to person, place, and time.  Psychiatric:        Mood and Affect: Mood normal.        Behavior: Behavior normal.     Wt Readings from Last 3 Encounters:  09/18/22 133 lb (60.3 kg)  04/04/22 133 lb (60.3 kg)  10/02/21 131 lb 3.2 oz (59.5 kg)    BP (!) 90/50   Pulse 73   Ht 5\' 4"  (1.626 m)   Wt 133 lb (60.3 kg)   SpO2 97%   BMI 22.83 kg/m   Assessment and Plan:  Problem List Items Addressed This Visit   None Visit Diagnoses     Cellulitis of ankle    -  Primary   from unknown insect bite recommend topical antibiotic ointment follow up if worsening       No follow-ups on file.   Partially dictated using Dragon software, any errors are  not intentional.  Reubin Milan,  MD [facility] Health Primary Care and Sports Medicine [place], [place]

## 2022-09-18 NOTE — Patient Instructions (Signed)
Apply antibiotic ointment daily until healed.

## 2022-10-03 ENCOUNTER — Ambulatory Visit (INDEPENDENT_AMBULATORY_CARE_PROVIDER_SITE_OTHER): Payer: Medicare HMO | Admitting: Internal Medicine

## 2022-10-03 ENCOUNTER — Encounter: Payer: Self-pay | Admitting: Internal Medicine

## 2022-10-03 VITALS — BP 124/78 | HR 79 | Ht 64.0 in | Wt 132.2 lb

## 2022-10-03 DIAGNOSIS — I10 Essential (primary) hypertension: Secondary | ICD-10-CM

## 2022-10-03 DIAGNOSIS — Z1231 Encounter for screening mammogram for malignant neoplasm of breast: Secondary | ICD-10-CM | POA: Diagnosis not present

## 2022-10-03 DIAGNOSIS — I700 Atherosclerosis of aorta: Secondary | ICD-10-CM

## 2022-10-03 NOTE — Assessment & Plan Note (Signed)
Stable exam with well controlled BP.  Currently taking amlodipine and lisinopril. Tolerating medications without concerns or side effects. Will continue to recommend low sodium diet and current regimen.

## 2022-10-03 NOTE — Progress Notes (Signed)
Date:  10/03/2022   Name:  Angela Herrera   DOB:  [birth year]   MRN:  [id number]   Chief Complaint: Hypertension  Hypertension This is a chronic problem. The problem is controlled. Pertinent negatives include no chest pain, headaches, palpitations or shortness of breath. Past treatments include calcium channel blockers and ACE inhibitors. The current treatment provides significant improvement. There is no history of kidney disease, CAD/MI or CVA.    Lab Results  Component Value Date   NA 145 (H) 04/04/2022   K 5.0 04/04/2022   CO2 24 04/04/2022   GLUCOSE 89 04/04/2022   BUN 9 04/04/2022   CREATININE 0.72 04/04/2022   CALCIUM 9.9 04/04/2022   EGFR 89 04/04/2022   GFRNONAA 89 03/29/2020   Lab Results  Component Value Date   CHOL 171 04/04/2022   HDL 49 04/04/2022   LDLCALC 102 (H) 04/04/2022   TRIG 112 04/04/2022   CHOLHDL 3.5 04/04/2022   Lab Results  Component Value Date   TSH 1.600 04/04/2022   Lab Results  Component Value Date   HGBA1C 5.5 04/03/2021   Lab Results  Component Value Date   WBC 6.7 04/04/2022   HGB 13.6 04/04/2022   HCT 41.0 04/04/2022   MCV 87 04/04/2022   PLT 472 (H) 04/04/2022   Lab Results  Component Value Date   ALT 9 04/04/2022   AST 15 04/04/2022   ALKPHOS 76 04/04/2022   BILITOT 0.3 04/04/2022   No results found for: "25OHVITD2", "25OHVITD3", "VD25OH"   Review of Systems  Constitutional:  Negative for fatigue and unexpected weight change.  HENT:  Negative for nosebleeds.   Eyes:  Negative for visual disturbance.  Respiratory:  Negative for cough, chest tightness, shortness of breath and wheezing.   Cardiovascular:  Negative for chest pain, palpitations and leg swelling.  Gastrointestinal:  Negative for abdominal pain, constipation and diarrhea.  Neurological:  Negative for dizziness, weakness, light-headedness and headaches.  Psychiatric/Behavioral:  Positive for sleep disturbance (wakes up several times per night with hot  flashes).     Patient Active Problem List   Diagnosis Date Noted   Aortic atherosclerosis (HCC) 09/25/2018   Hemangioma of liver 03/24/2018   GERD without esophagitis 09/14/2015   Peripheral vascular disease (HCC) 02/23/2015   Tobacco use disorder 02/23/2015   Mixed hyperlipidemia 10/22/2014   Environmental and seasonal allergies 10/22/2014   Essential hypertension 10/22/2014    Allergies  Allergen Reactions   Tetracycline Itching    Past Surgical History:  Procedure Laterality Date   COLONOSCOPY  08/05/2006   normal   COLONOSCOPY WITH PROPOFOL N/A 06/12/2017   Procedure: COLONOSCOPY WITH PROPOFOL;  Surgeon: Toney Reil, MD;  Location: [facility];  Service: Endoscopy;  Laterality: N/A;   TUBAL LIGATION Bilateral     Social History   Tobacco Use   Smoking status: Every Day    Packs/day: 0.50    Years: 55.00    Additional pack years: 0.00    Total pack years: 27.50    Types: Cigarettes    Start date: 1965   Smokeless tobacco: Never   Tobacco comments:    since age 55; quit 02/2019 after dental work; started smoking again 11/2019; smoking .75 PPD 03/22/21  Vaping Use   Vaping Use: Never used  Substance Use Topics   Alcohol use: Yes    Alcohol/week: 4.0 standard drinks of alcohol    Types: 4 Shots of liquor per week   Drug use: No  Medication list has been reviewed and updated.  Current Meds  Medication Sig   amLODipine (NORVASC) 5 MG tablet TAKE 1 TABLET(5 MG) BY MOUTH DAILY   aspirin 81 MG tablet Take 81 mg by mouth daily.   cholecalciferol (VITAMIN D3) 25 MCG (1000 UT) tablet Take 1,000 Units by mouth daily.   lisinopril (ZESTRIL) 10 MG tablet TAKE 1 TABLET(10 MG) BY MOUTH DAILY   Multiple Vitamins-Minerals (MULTIVITAMIN WITH MINERALS) tablet Take 1 tablet daily by mouth.   omeprazole (PRILOSEC) 40 MG capsule TAKE 1 CAPSULE BY MOUTH DAILY   rosuvastatin (CRESTOR) 10 MG tablet TAKE 1 TABLET(10 MG) BY MOUTH DAILY       10/03/2022     9:08 AM 09/18/2022    8:52 AM 04/04/2022    8:38 AM 10/02/2021    8:15 AM  GAD 7 : Generalized Anxiety Score  Nervous, Anxious, on Edge 0 0 0 1  Control/stop worrying 0 0 0 0  Worry too much - different things 1 1 0 1  Trouble relaxing 0 1 0 1  Restless 1 1 1 1   Easily annoyed or irritable 1 1 2 1   Afraid - awful might happen 0 1 0 0  Total GAD 7 Score 3 5 3 5   Anxiety Difficulty Not difficult at all Not difficult at all Not difficult at all Not difficult at all       10/03/2022    9:08 AM 09/18/2022    8:52 AM 05/14/2022    9:05 AM  Depression screen PHQ 2/9  Decreased Interest 1 0 0  Down, Depressed, Hopeless 0 1 0  PHQ - 2 Score 1 1 0  Altered sleeping 2 2   Tired, decreased energy 2 1   Change in appetite 1 1   Feeling bad or failure about yourself  0 0   Trouble concentrating 1 0   Moving slowly or fidgety/restless 0 0   Suicidal thoughts 0 0   PHQ-9 Score 7 5   Difficult doing work/chores Not difficult at all Not difficult at all     BP Readings from Last 3 Encounters:  10/03/22 124/78  09/18/22 (!) 90/50  05/14/22 132/76    Physical Exam Vitals and nursing note reviewed.  Constitutional:      General: She is not in acute distress.    Appearance: She is well-developed.  HENT:     Head: Normocephalic and atraumatic.  Neck:     Vascular: No carotid bruit.  Cardiovascular:     Rate and Rhythm: Normal rate and regular rhythm.     Pulses: Normal pulses.     Heart sounds: No murmur heard. Pulmonary:     Effort: Pulmonary effort is normal. No respiratory distress.     Breath sounds: No wheezing or rhonchi.  Musculoskeletal:     Cervical back: Normal range of motion.     Right lower leg: No edema.     Left lower leg: No edema.  Lymphadenopathy:     Cervical: No cervical adenopathy.  Skin:    General: Skin is warm and dry.     Findings: No rash.  Neurological:     General: No focal deficit present.     Mental Status: She is alert and oriented to person,  place, and time.  Psychiatric:        Mood and Affect: Mood normal.        Behavior: Behavior normal.     Wt Readings from Last 3 Encounters:  10/03/22  132 lb 3.2 oz (60 kg)  09/18/22 133 lb (60.3 kg)  04/04/22 133 lb (60.3 kg)    BP 124/78   Pulse 79   Ht 5\' 4"  (1.626 m)   Wt 132 lb 3.2 oz (60 kg)   SpO2 98%   BMI 22.69 kg/m   Assessment and Plan:  Problem List Items Addressed This Visit     Essential hypertension - Primary (Chronic)    Stable exam with well controlled BP.  Currently taking amlodipine and lisinopril. Tolerating medications without concerns or side effects. Will continue to recommend low sodium diet and current regimen.       Aortic atherosclerosis (HCC)    On appropriate statin therapy.      Other Visit Diagnoses     Encounter for screening mammogram for breast cancer       Relevant Orders   MM 3D SCREENING MAMMOGRAM BILATERAL BREAST       No follow-ups on file.   Partially dictated using Dragon software, any errors are not intentional.  Reubin Milan, MD [facility] Health Primary Care and Sports Medicine [place], [place]

## 2022-10-03 NOTE — Assessment & Plan Note (Signed)
On appropriate statin therapy 

## 2022-10-04 ENCOUNTER — Ambulatory Visit: Payer: Medicare HMO | Admitting: Internal Medicine

## 2022-11-14 ENCOUNTER — Other Ambulatory Visit: Payer: Self-pay | Admitting: Internal Medicine

## 2022-11-14 DIAGNOSIS — I10 Essential (primary) hypertension: Secondary | ICD-10-CM

## 2022-11-15 NOTE — Telephone Encounter (Signed)
Requested Prescriptions  Pending Prescriptions Disp Refills   amLODipine (NORVASC) 5 MG tablet [Pharmacy Med Name: AMLODIPINE BESYLATE 5 MG TAB] 90 tablet 1    Sig: TAKE 1 TABLET BY MOUTH EVERY DAY     Cardiovascular: Calcium Channel Blockers 2 Passed - 11/14/2022  4:10 PM      Passed - Last BP in normal range    BP Readings from Last 1 Encounters:  10/03/22 124/78         Passed - Last Heart Rate in normal range    Pulse Readings from Last 1 Encounters:  10/03/22 79         Passed - Valid encounter within last 6 months    Recent Outpatient Visits           1 month ago Essential hypertension   [place] [facility] at [facility], Nyoka Cowden, MD   1 month ago Cellulitis of ankle   [place] [facility] at [facility], Nyoka Cowden, MD   7 months ago Annual physical exam   [facility] Health Primary Care & Sports Medicine at [facility], Nyoka Cowden, MD   1 year ago Essential hypertension   [place] [facility] Medicine at [facility], Nyoka Cowden, MD   1 year ago Annual physical exam   [facility] at [facility], Nyoka Cowden, MD       Future Appointments             In 4 months Judithann Graves Nyoka Cowden, MD [facility] Health Primary Care & Sports Medicine at [facility], [facility]             lisinopril (ZESTRIL) 10 MG tablet [Pharmacy Med Name: LISINOPRIL 10 MG TABLET] 90 tablet 1    Sig: TAKE 1 TABLET BY MOUTH EVERY DAY     Cardiovascular:  ACE Inhibitors Failed - 11/14/2022  4:10 PM      Failed - Cr in normal range and within 180 days    Creatinine, Ser  Date Value Ref Range Status  04/04/2022 0.72 0.57 - 1.00 mg/dL Final         Failed - K in normal range and within 180 days    Potassium  Date Value Ref Range Status  04/04/2022 5.0 3.5 - 5.2 mmol/L Final         Passed - Patient is not pregnant      Passed - Last BP in normal range    BP  Readings from Last 1 Encounters:  10/03/22 124/78         Passed - Valid encounter within last 6 months    Recent Outpatient Visits           1 month ago Essential hypertension   [place] [facility] at [facility], Nyoka Cowden, MD   1 month ago Cellulitis of ankle   [facility] Health Primary Care & Sports Medicine at [facility], Nyoka Cowden, MD   7 months ago Annual physical exam   [facility] Health Primary Care & Sports Medicine at [facility], Nyoka Cowden, MD   1 year ago Essential hypertension   [place] [facility] Medicine at [facility], Nyoka Cowden, MD   1 year ago Annual physical exam   [facility] Health Primary Care & Sports Medicine at [facility], Nyoka Cowden, MD  Future Appointments             In 4 months Judithann Graves, Nyoka Cowden, MD [facility] Health Primary Care & Sports Medicine at [facility]

## 2023-02-10 ENCOUNTER — Other Ambulatory Visit: Payer: Self-pay | Admitting: Internal Medicine

## 2023-02-10 DIAGNOSIS — E782 Mixed hyperlipidemia: Secondary | ICD-10-CM

## 2023-03-16 ENCOUNTER — Other Ambulatory Visit: Payer: Self-pay | Admitting: Internal Medicine

## 2023-03-16 DIAGNOSIS — K219 Gastro-esophageal reflux disease without esophagitis: Secondary | ICD-10-CM

## 2023-03-18 NOTE — Telephone Encounter (Signed)
Requested Prescriptions  Pending Prescriptions Disp Refills   omeprazole (PRILOSEC) 40 MG capsule [Pharmacy Med Name: OMEPRAZOLE DR 40 MG CAPSULE] 90 capsule 1    Sig: TAKE 1 CAPSULE BY MOUTH EVERY DAY     Gastroenterology: Proton Pump Inhibitors Passed - 03/16/2023  8:32 AM      Passed - Valid encounter within last 12 months    Recent Outpatient Visits           5 months ago Essential hypertension   [place] [facility] at [facility], Nyoka Cowden, MD   6 months ago Cellulitis of ankle   [facility] Health Primary Care & Sports Medicine at [facility], Nyoka Cowden, MD   11 months ago Annual physical exam   [facility] Health Primary Care & Sports Medicine at [facility], Nyoka Cowden, MD   1 year ago Essential hypertension   [place] [facility] Medicine at [facility], Nyoka Cowden, MD   1 year ago Annual physical exam   [facility] Primary Care & Sports Medicine at [facility], Nyoka Cowden, MD       Future Appointments             In 3 weeks Judithann Graves Nyoka Cowden, MD [facility] Health Primary Care & Sports Medicine at [facility], [facility]

## 2023-03-26 ENCOUNTER — Other Ambulatory Visit: Payer: Self-pay | Admitting: Acute Care

## 2023-03-26 DIAGNOSIS — F17210 Nicotine dependence, cigarettes, uncomplicated: Secondary | ICD-10-CM

## 2023-03-26 DIAGNOSIS — Z87891 Personal history of nicotine dependence: Secondary | ICD-10-CM

## 2023-04-04 ENCOUNTER — Ambulatory Visit
Admission: RE | Admit: 2023-04-04 | Discharge: 2023-04-04 | Disposition: A | Discharge status: Home / Self Care | Payer: Medicare HMO | Source: Ambulatory Visit | Attending: Internal Medicine | Admitting: Internal Medicine

## 2023-04-04 DIAGNOSIS — Z1231 Encounter for screening mammogram for malignant neoplasm of breast: Secondary | ICD-10-CM | POA: Insufficient documentation

## 2023-04-08 ENCOUNTER — Ambulatory Visit (INDEPENDENT_AMBULATORY_CARE_PROVIDER_SITE_OTHER): Payer: Medicare HMO | Admitting: Internal Medicine

## 2023-04-08 ENCOUNTER — Encounter: Payer: Self-pay | Admitting: Internal Medicine

## 2023-04-08 VITALS — BP 112/64 | HR 61 | Ht 64.0 in | Wt 133.0 lb

## 2023-04-08 DIAGNOSIS — I10 Essential (primary) hypertension: Secondary | ICD-10-CM | POA: Diagnosis not present

## 2023-04-08 DIAGNOSIS — Z0000 Encounter for general adult medical examination without abnormal findings: Secondary | ICD-10-CM

## 2023-04-08 DIAGNOSIS — F17200 Nicotine dependence, unspecified, uncomplicated: Secondary | ICD-10-CM

## 2023-04-08 DIAGNOSIS — K219 Gastro-esophageal reflux disease without esophagitis: Secondary | ICD-10-CM | POA: Diagnosis not present

## 2023-04-08 DIAGNOSIS — E782 Mixed hyperlipidemia: Secondary | ICD-10-CM

## 2023-04-08 NOTE — Assessment & Plan Note (Signed)
LDL is  Lab Results  Component Value Date   LDLCALC 102 (H) 04/04/2022    Current regimen is Crestor.  Tolerating medications well without issues.

## 2023-04-08 NOTE — Assessment & Plan Note (Signed)
 Controlled BP with normal exam. Current regimen is lisinopril and amlodipine. Will continue same medications; encourage continued reduced sodium diet.

## 2023-04-08 NOTE — Progress Notes (Signed)
Date:  04/08/2023   Name:  Angela Herrera   DOB:  05-11-1950   MRN:  [id number]   Chief Complaint: Annual Exam Angela Herrera is a 72 y.o. female who presents today for her Complete Annual Exam. She feels well. She reports exercising / walking. She reports she is sleeping poorly. Breast complaints - none.  Mammogram: 03/2023 DEXA: 06/2006 Colonoscopy: 05/2017 repeat 10 yrs  Health Maintenance Due  Topic Date Due   Lung Cancer Screening  04/26/2023   Medicare Annual Wellness (AWV)  05/15/2023    Immunization History  Administered Date(s) Administered   DTaP 08/31/2022   Moderna Covid-19 Fall Seasonal Vaccine 65yrs & older 01/09/2023   Moderna Covid-19 Vaccine Bivalent Booster 2yrs & up 01/24/2021   Moderna Sars-Covid-2 Vaccination 05/29/2019, 06/26/2019, 02/26/2020   PNEUMOCOCCAL CONJUGATE-20 04/04/2022   Pneumococcal Conjugate-13 02/28/2016   Pneumococcal Polysaccharide-23 09/24/2012, 02/27/2018   Zoster Recombinant(Shingrix) 03/27/2019, 05/28/2019   Zoster, Live 12/08/2013     Hypertension This is a chronic problem. The problem is controlled. Pertinent negatives include no chest pain, headaches, palpitations or shortness of breath. Past treatments include ACE inhibitors and calcium channel blockers. The current treatment provides significant improvement.  Hyperlipidemia This is a chronic problem. The problem is controlled. Pertinent negatives include no chest pain or shortness of breath. Current antihyperlipidemic treatment includes statins. The current treatment provides significant improvement of lipids.  Gastroesophageal Reflux She complains of heartburn. She reports no abdominal pain, no chest pain, no coughing or no wheezing. This is a recurrent problem. The problem occurs rarely. Pertinent negatives include no fatigue. She has tried a PPI for the symptoms.    Review of Systems  Constitutional:  Negative for fatigue and unexpected weight change.  HENT:  Negative  for nosebleeds and trouble swallowing.   Eyes:  Negative for visual disturbance.  Respiratory:  Negative for cough, chest tightness, shortness of breath and wheezing.   Cardiovascular:  Negative for chest pain, palpitations and leg swelling.  Gastrointestinal:  Positive for heartburn. Negative for abdominal pain, constipation and diarrhea.  Genitourinary:  Negative for frequency and urgency.  Musculoskeletal:  Negative for arthralgias.  Skin:        Multiple skin tags on neck - sometimes irritated by clothing  Neurological:  Negative for dizziness, weakness, light-headedness and headaches.  Psychiatric/Behavioral:  Positive for sleep disturbance. Negative for dysphoric mood. The patient is not nervous/anxious.      Lab Results  Component Value Date   NA 145 (H) 04/04/2022   K 5.0 04/04/2022   CO2 24 04/04/2022   GLUCOSE 89 04/04/2022   BUN 9 04/04/2022   CREATININE 0.72 04/04/2022   CALCIUM 9.9 04/04/2022   EGFR 89 04/04/2022   GFRNONAA 89 03/29/2020   Lab Results  Component Value Date   CHOL 171 04/04/2022   HDL 49 04/04/2022   LDLCALC 102 (H) 04/04/2022   TRIG 112 04/04/2022   CHOLHDL 3.5 04/04/2022   Lab Results  Component Value Date   TSH 1.600 04/04/2022   Lab Results  Component Value Date   HGBA1C 5.5 04/03/2021   Lab Results  Component Value Date   WBC 6.7 04/04/2022   HGB 13.6 04/04/2022   HCT 41.0 04/04/2022   MCV 87 04/04/2022   PLT 472 (H) 04/04/2022   Lab Results  Component Value Date   ALT 9 04/04/2022   AST 15 04/04/2022   ALKPHOS 76 04/04/2022   BILITOT 0.3 04/04/2022   No results found for: "25OHVITD2", "25OHVITD3", "  VD25OH"   Patient Active Problem List   Diagnosis Date Noted   Aortic atherosclerosis (HCC) 09/25/2018   Hemangioma of liver 03/24/2018   GERD without esophagitis 09/14/2015   Peripheral vascular disease (HCC) 02/23/2015   Tobacco use disorder 02/23/2015   Mixed hyperlipidemia 10/22/2014   Environmental and seasonal  allergies 10/22/2014   Essential hypertension 10/22/2014    Allergies  Allergen Reactions   Tetracycline Itching    Past Surgical History:  Procedure Laterality Date   COLONOSCOPY  08/05/2006   normal   COLONOSCOPY WITH PROPOFOL N/A 06/12/2017   Procedure: COLONOSCOPY WITH PROPOFOL;  Surgeon: Toney Reil, MD;  Location: [facility];  Service: Endoscopy;  Laterality: N/A;   TUBAL LIGATION Bilateral     Social History   Tobacco Use   Smoking status: Former    Current packs/day: 0.75    Average packs/day: 0.5 packs/day for 59.2 years (30.4 ttl pk-yrs)    Types: Cigarettes    Start date: 67    Quit date: 02/22/2019   Smokeless tobacco: Never   Tobacco comments:    since age 16; quit 02/2019 after dental work; started smoking again 11/2019; smoking .75 PPD 03/22/21  Vaping Use   Vaping status: Never Used  Substance Use Topics   Alcohol use: Yes    Alcohol/week: 4.0 standard drinks of alcohol    Types: 4 Shots of liquor per week   Drug use: No     Medication list has been reviewed and updated.  Current Meds  Medication Sig   aspirin 81 MG tablet Take 81 mg by mouth daily.   cholecalciferol (VITAMIN D3) 25 MCG (1000 UT) tablet Take 1,000 Units by mouth daily.   lisinopril (ZESTRIL) 10 MG tablet TAKE 1 TABLET BY MOUTH EVERY DAY   Multiple Vitamins-Minerals (MULTIVITAMIN WITH MINERALS) tablet Take 1 tablet daily by mouth.   omeprazole (PRILOSEC) 40 MG capsule TAKE 1 CAPSULE BY MOUTH EVERY DAY   rosuvastatin (CRESTOR) 10 MG tablet TAKE 1 TABLET BY MOUTH EVERY DAY       04/08/2023    8:38 AM 10/03/2022    9:08 AM 09/18/2022    8:52 AM 04/04/2022    8:38 AM  GAD 7 : Generalized Anxiety Score  Nervous, Anxious, on Edge 0 0 0 0  Control/stop worrying 0 0 0 0  Worry too much - different things 0 1 1 0  Trouble relaxing 0 0 1 0  Restless 0 1 1 1   Easily annoyed or irritable 0 1 1 2   Afraid - awful might happen 0 0 1 0  Total GAD 7 Score 0 3 5 3    Anxiety Difficulty Not difficult at all Not difficult at all Not difficult at all Not difficult at all       04/08/2023    8:37 AM 10/03/2022    9:08 AM 09/18/2022    8:52 AM  Depression screen PHQ 2/9  Decreased Interest 0 1 0  Down, Depressed, Hopeless 0 0 1  PHQ - 2 Score 0 1 1  Altered sleeping 3 2 2   Tired, decreased energy 1 2 1   Change in appetite 2 1 1   Feeling bad or failure about yourself  0 0 0  Trouble concentrating 0 1 0  Moving slowly or fidgety/restless 0 0 0  Suicidal thoughts 0 0 0  PHQ-9 Score 6 7 5   Difficult doing work/chores Not difficult at all Not difficult at all Not difficult at all    BP Readings  from Last 3 Encounters:  04/08/23 112/64  10/03/22 124/78  09/18/22 (!) 90/50    Physical Exam Vitals and nursing note reviewed.  Constitutional:      General: She is not in acute distress.    Appearance: She is well-developed.  HENT:     Head: Normocephalic and atraumatic.     Right Ear: Tympanic membrane and ear canal normal.     Left Ear: Tympanic membrane and ear canal normal.     Nose:     Right Sinus: No maxillary sinus tenderness.     Left Sinus: No maxillary sinus tenderness.  Eyes:     General: No scleral icterus.       Right eye: No discharge.        Left eye: No discharge.     Conjunctiva/sclera: Conjunctivae normal.  Neck:     Thyroid: No thyromegaly.     Vascular: No carotid bruit.  Cardiovascular:     Rate and Rhythm: Normal rate and regular rhythm.     Pulses: Normal pulses.     Heart sounds: Normal heart sounds.  Pulmonary:     Effort: Pulmonary effort is normal. No respiratory distress.     Breath sounds: No wheezing.  Abdominal:     General: Bowel sounds are normal.     Palpations: Abdomen is soft.     Tenderness: There is no abdominal tenderness.  Musculoskeletal:     Cervical back: Normal range of motion. No erythema.     Right lower leg: No edema.     Left lower leg: No edema.  Lymphadenopathy:     Cervical: No  cervical adenopathy.  Skin:    General: Skin is warm and dry.     Findings: No rash.     Comments: Benign skin tags around neck  Neurological:     General: No focal deficit present.     Mental Status: She is alert and oriented to person, place, and time.     Cranial Nerves: No cranial nerve deficit.     Sensory: No sensory deficit.     Deep Tendon Reflexes: Reflexes are normal and symmetric.  Psychiatric:        Attention and Perception: Attention normal.        Mood and Affect: Mood normal.     Wt Readings from Last 3 Encounters:  04/08/23 133 lb (60.3 kg)  10/03/22 132 lb 3.2 oz (60 kg)  09/18/22 133 lb (60.3 kg)    BP 112/64   Pulse 61   Ht 5\' 4"  (1.626 m)   Wt 133 lb (60.3 kg)   SpO2 99%   BMI 22.83 kg/m   Assessment and Plan:  Problem List Items Addressed This Visit       Unprioritized   Mixed hyperlipidemia (Chronic)   LDL is  Lab Results  Component Value Date   LDLCALC 102 (H) 04/04/2022    Current regimen is Crestor.  Tolerating medications well without issues.       Relevant Orders   Lipid panel   Essential hypertension (Chronic)   Controlled BP with normal exam. Current regimen is lisinopril and amlodipine. Will continue same medications; encourage continued reduced sodium diet.       Relevant Orders   CBC with Differential/Platelet   Comprehensive metabolic panel   TSH   Urinalysis, Routine w reflex microscopic   Tobacco use disorder (Chronic)   She continues to smoke but is trying to cut back Participating in LDCT screening -  scheduled      GERD without esophagitis   Reflux symptoms are minimal on current therapy - omeprazole. No red flag signs such as weight loss, n/v, melena       Relevant Orders   CBC with Differential/Platelet   Other Visit Diagnoses       Annual physical exam    -  Primary   up to date on screenings and immunizations MAW scheduled       Return in about 6 months (around 10/07/2023) for HTN.    Reubin Milan, MD [facility] [place]

## 2023-04-08 NOTE — Assessment & Plan Note (Signed)
She continues to smoke but is trying to cut back Participating in LDCT screening - scheduled

## 2023-04-08 NOTE — Assessment & Plan Note (Signed)
Reflux symptoms are minimal on current therapy - omeprazole. No red flag signs such as weight loss, n/v, melena  

## 2023-04-09 LAB — LIPID PANEL
Chol/HDL Ratio: 3.9 {ratio} (ref 0.0–4.4)
Cholesterol, Total: 188 mg/dL (ref 100–199)
HDL: 48 mg/dL (ref >39)
LDL Chol Calc (NIH): 115 mg/dL — ABNORMAL HIGH (ref 0–99)
Triglycerides: 141 mg/dL (ref 0–149)
VLDL Cholesterol Cal: 25 mg/dL (ref 5–40)

## 2023-04-09 LAB — COMPREHENSIVE METABOLIC PANEL
ALT: 17 [IU]/L (ref 0–32)
AST: 18 [IU]/L (ref 0–40)
Albumin: 4.3 g/dL (ref 3.8–4.8)
Alkaline Phosphatase: 73 [IU]/L (ref 44–121)
BUN/Creatinine Ratio: 19 (ref 12–28)
BUN: 12 mg/dL (ref 8–27)
Bilirubin Total: 0.3 mg/dL (ref 0.0–1.2)
CO2: 23 mmol/L (ref 20–29)
Calcium: 10.1 mg/dL (ref 8.7–10.3)
Chloride: 103 mmol/L (ref 96–106)
Creatinine, Ser: 0.63 mg/dL (ref 0.57–1.00)
Globulin, Total: 3.1 g/dL (ref 1.5–4.5)
Glucose: 90 mg/dL (ref 70–99)
Potassium: 4.7 mmol/L (ref 3.5–5.2)
Sodium: 141 mmol/L (ref 134–144)
Total Protein: 7.4 g/dL (ref 6.0–8.5)
eGFR: 94 mL/min/{1.73_m2} (ref >59)

## 2023-04-09 LAB — CBC WITH DIFFERENTIAL/PLATELET
Basophils Absolute: 0 10*3/uL (ref 0.0–0.2)
Basos: 1 %
EOS (ABSOLUTE): 0.3 10*3/uL (ref 0.0–0.4)
Eos: 4 %
Hematocrit: 44.8 % (ref 34.0–46.6)
Hemoglobin: 14.3 g/dL (ref 11.1–15.9)
Immature Grans (Abs): 0 10*3/uL (ref 0.0–0.1)
Immature Granulocytes: 0 %
Lymphocytes Absolute: 2.8 10*3/uL (ref 0.7–3.1)
Lymphs: 49 %
MCH: 29.1 pg (ref 26.6–33.0)
MCHC: 31.9 g/dL (ref 31.5–35.7)
MCV: 91 fL (ref 79–97)
Monocytes Absolute: 0.7 10*3/uL (ref 0.1–0.9)
Monocytes: 13 %
Neutrophils Absolute: 1.9 10*3/uL (ref 1.4–7.0)
Neutrophils: 33 %
Platelets: 434 10*3/uL (ref 150–450)
RBC: 4.91 x10E6/uL (ref 3.77–5.28)
RDW: 13.5 % (ref 11.7–15.4)
WBC: 5.7 10*3/uL (ref 3.4–10.8)

## 2023-04-09 LAB — TSH: TSH: 1.53 u[IU]/mL (ref 0.450–4.500)

## 2023-04-09 LAB — URINALYSIS, ROUTINE W REFLEX MICROSCOPIC
Bilirubin, UA: NEGATIVE
Glucose, UA: NEGATIVE
Ketones, UA: NEGATIVE
Leukocytes,UA: NEGATIVE
Nitrite, UA: NEGATIVE
Protein,UA: NEGATIVE
RBC, UA: NEGATIVE
Specific Gravity, UA: 1.021 (ref 1.005–1.030)
Urobilinogen, Ur: 0.2 mg/dL (ref 0.2–1.0)
pH, UA: 6 (ref 5.0–7.5)

## 2023-04-30 ENCOUNTER — Ambulatory Visit
Admission: RE | Admit: 2023-04-30 | Discharge: 2023-04-30 | Disposition: A | Discharge status: Home / Self Care | Payer: Medicare HMO | Source: Ambulatory Visit | Attending: Internal Medicine | Admitting: Internal Medicine

## 2023-04-30 DIAGNOSIS — F17210 Nicotine dependence, cigarettes, uncomplicated: Secondary | ICD-10-CM | POA: Diagnosis present

## 2023-04-30 DIAGNOSIS — Z87891 Personal history of nicotine dependence: Secondary | ICD-10-CM | POA: Diagnosis present

## 2023-05-08 ENCOUNTER — Other Ambulatory Visit: Payer: Self-pay | Admitting: Internal Medicine

## 2023-05-08 DIAGNOSIS — I10 Essential (primary) hypertension: Secondary | ICD-10-CM

## 2023-05-08 NOTE — Telephone Encounter (Signed)
 Requested by interface surescripts.  Requested Prescriptions  Pending Prescriptions Disp Refills   lisinopril  (ZESTRIL ) 10 MG tablet [Pharmacy Med Name: LISINOPRIL  10 MG TABLET] 90 tablet 1    Sig: TAKE 1 TABLET BY MOUTH EVERY DAY     Cardiovascular:  ACE Inhibitors Passed - 05/08/2023  4:29 PM      Passed - Cr in normal range and within 180 days    Creatinine, Ser  Date Value Ref Range Status  04/08/2023 0.63 0.57 - 1.00 mg/dL Final         Passed - K in normal range and within 180 days    Potassium  Date Value Ref Range Status  04/08/2023 4.7 3.5 - 5.2 mmol/L Final         Passed - Patient is not pregnant      Passed - Last BP in normal range    BP Readings from Last 1 Encounters:  04/08/23 112/64         Passed - Valid encounter within last 6 months    Recent Outpatient Visits           1 month ago Annual physical exam   [place] [facility], [place], MD   7 months ago Essential hypertension   [place] [facility] at [facility], [place], MD   7 months ago Cellulitis of ankle   [facility] at [facility], [place]ales [place], MD   1 year ago Annual physical exam   [facility] at [facility], [place]ales [place], MD   1 year ago Essential hypertension   [place] [facility] at [facility], [place]ales [place], MD               amLODipine  (NORVASC ) 5 MG tablet [Pharmacy Med Name: AMLODIPINE  BESYLATE 5 MG TAB] 90 tablet 1    Sig: TAKE 1 TABLET BY MOUTH EVERY DAY     Cardiovascular: Calcium  Channel Blockers 2 Passed - 05/08/2023  4:29 PM      Passed - Last BP in normal range    BP Readings from Last 1 Encounters:  04/08/23 112/64         Passed - Last Heart Rate in normal range    Pulse Readings from Last 1 Encounters:  04/08/23 61         Passed - Valid encounter within last 6  months    Recent Outpatient Visits           1 month ago Annual physical exam   [place] [facility] at [facility], Chales [place], MD   7 months ago Essential hypertension   [place] [facility] at [facility], [place]ales [place], MD   7 months ago Cellulitis of ankle   [facility] at [facility], [place]ales [place], MD   1 year ago Annual physical exam   [facility] at [facility], [place]ales [place], MD   1 year ago Essential hypertension   [facility] at [facility], [place]ales [place], MD

## 2023-05-10 ENCOUNTER — Other Ambulatory Visit: Payer: Self-pay

## 2023-05-10 DIAGNOSIS — Z87891 Personal history of nicotine dependence: Secondary | ICD-10-CM

## 2023-05-10 DIAGNOSIS — Z122 Encounter for screening for malignant neoplasm of respiratory organs: Secondary | ICD-10-CM

## 2023-05-10 DIAGNOSIS — F17210 Nicotine dependence, cigarettes, uncomplicated: Secondary | ICD-10-CM

## 2023-08-04 ENCOUNTER — Other Ambulatory Visit: Payer: Self-pay | Admitting: Internal Medicine

## 2023-08-04 DIAGNOSIS — E782 Mixed hyperlipidemia: Secondary | ICD-10-CM

## 2023-08-06 NOTE — Telephone Encounter (Signed)
 Last OV 04/08/23 for CPE within protocol.  Requested Prescriptions  Pending Prescriptions Disp Refills   rosuvastatin (CRESTOR) 10 MG tablet [Pharmacy Med Name: ROSUVASTATIN CALCIUM 10 MG TAB] 90 tablet 2    Sig: TAKE 1 TABLET BY MOUTH EVERY DAY     Cardiovascular:  Antilipid - Statins 2 Failed - 08/06/2023  8:13 AM      Failed - Valid encounter within last 12 months    Recent Outpatient Visits   None            Failed - Lipid Panel in normal range within the last 12 months    Cholesterol, Total  Date Value Ref Range Status  04/08/2023 188 100 - 199 mg/dL Final   LDL Chol Calc (NIH)  Date Value Ref Range Status  04/08/2023 115 (H) 0 - 99 mg/dL Final   HDL  Date Value Ref Range Status  04/08/2023 48 >39 mg/dL Final   Triglycerides  Date Value Ref Range Status  04/08/2023 141 0 - 149 mg/dL Final         Passed - Cr in normal range and within 360 days    Creatinine, Ser  Date Value Ref Range Status  04/08/2023 0.63 0.57 - 1.00 mg/dL Final         Passed - Patient is not pregnant

## 2023-09-08 ENCOUNTER — Other Ambulatory Visit: Payer: Self-pay | Admitting: Internal Medicine

## 2023-09-08 DIAGNOSIS — K219 Gastro-esophageal reflux disease without esophagitis: Secondary | ICD-10-CM

## 2023-09-10 NOTE — Telephone Encounter (Signed)
 Requested Prescriptions  Pending Prescriptions Disp Refills   omeprazole  (PRILOSEC) 40 MG capsule [Pharmacy Med Name: OMEPRAZOLE  DR 40 MG CAPSULE] 90 capsule 1    Sig: TAKE 1 CAPSULE BY MOUTH EVERY DAY     Gastroenterology: Proton Pump Inhibitors Failed - 09/10/2023  3:00 PM      Failed - Valid encounter within last 12 months    Recent Outpatient Visits   None

## 2023-09-19 ENCOUNTER — Encounter: Payer: Self-pay | Admitting: Emergency Medicine

## 2023-09-25 ENCOUNTER — Ambulatory Visit: Admitting: Emergency Medicine

## 2023-09-25 VITALS — BP 138/79 | HR 70 | Ht 64.0 in | Wt 136.0 lb

## 2023-09-25 DIAGNOSIS — Z0000 Encounter for general adult medical examination without abnormal findings: Secondary | ICD-10-CM | POA: Diagnosis not present

## 2023-09-25 DIAGNOSIS — Z1231 Encounter for screening mammogram for malignant neoplasm of breast: Secondary | ICD-10-CM

## 2023-09-25 DIAGNOSIS — Z780 Asymptomatic menopausal state: Secondary | ICD-10-CM

## 2023-09-25 NOTE — Patient Instructions (Signed)
 Angela Herrera , Thank you for taking time out of your busy schedule to complete your Annual Wellness Visit with me. I enjoyed our conversation and look forward to speaking with you again next year. I, as well as your care team,  appreciate your ongoing commitment to your health goals. Please review the following plan we discussed and let me know if I can assist you in the future. Your Game plan/ To Do List    Referrals: None  Follow up Visits: Next Medicare AWV with our clinical staff: 09/30/24 @ 8:50am (phone visit)   Have you seen your provider in the last 6 months (3 months if uncontrolled diabetes)? Yes Next Office Visit with your provider: 10/10/23 @ 9:20am with Dr. Gala Jubilee  Clinician Recommendations: I have placed orders for mammogram (due 04/06/24) and bone density scan. Call [phone] to schedule at your convenience. Aim for 30 minutes of exercise or brisk walking, 6-8 glasses of water , and 5 servings of fruits and vegetables each day.       This is a list of the screening recommended for you and due dates:  Health Maintenance  Topic Date Due   DEXA scan (bone density measurement)  07/09/2011   COVID-19 Vaccine (6 - 2024-25 season) 07/09/2023   Flu Shot  11/22/2023   Mammogram  04/03/2024   Screening for Lung Cancer  04/29/2024   Medicare Annual Wellness Visit  09/24/2024   Colon Cancer Screening  06/13/2027   DTaP/Tdap/Td vaccine (2 - Tdap) 08/30/2032   Pneumonia Vaccine  Completed   Hepatitis C Screening  Completed   Zoster (Shingles) Vaccine  Completed   HPV Vaccine  Aged Out   Meningitis B Vaccine  Aged Out    Advanced directives: (Declined) Advance directive discussed with you today. Even though you declined this today, please call our office should you change your mind, and we can give you the proper paperwork for you to fill out. Advance Care Planning is important because it:  [x]  Makes sure you receive the medical care that is consistent with your values, goals, and  preferences  [x]  It provides guidance to your family and loved ones and reduces their decisional burden about whether or not they are making the right decisions based on your wishes.  Follow the link provided in your after visit summary or read over the paperwork we have mailed to you to help you started getting your Advance Directives in place. If you need assistance in completing these, please reach out to us  so that we can help you!  See attachments for Preventive Care and Fall Prevention Tips.   Fall Prevention in the Home, Adult Falls can cause injuries and affect people of all ages. There are many simple things that you can do to make your home safe and to help prevent falls. If you need it, ask for help making these changes. What actions can I take to prevent falls? General information Use good lighting in all rooms. Make sure to: Replace any light bulbs that burn out. Turn on lights if it is dark and use night-lights. Keep items that you use often in easy-to-reach places. Lower the shelves around your home if needed. Move furniture so that there are clear paths around it. Do not keep throw rugs or other things on the floor that can make you trip. If any of your floors are uneven, fix them. Add color or contrast paint or tape to clearly mark and help you see: Grab bars or handrails. First  and last steps of staircases. Where the edge of each step is. If you use a ladder or stepladder: Make sure that it is fully opened. Do not climb a closed ladder. Make sure the sides of the ladder are locked in place. Have someone hold the ladder while you use it. Know where your pets are as you move through your home. What can I do in the bathroom?     Keep the floor dry. Clean up any water  that is on the floor right away. Remove soap buildup in the bathtub or shower. Buildup makes bathtubs and showers slippery. Use non-skid mats or decals on the floor of the bathtub or shower. Attach bath  mats securely with double-sided, non-slip rug tape. If you need to sit down while you are in the shower, use a non-slip stool. Install grab bars by the toilet and in the bathtub and shower. Do not use towel bars as grab bars. What can I do in the bedroom? Make sure that you have a light by your bed that is easy to reach. Do not use any sheets or blankets on your bed that hang to the floor. Have a firm bench or chair with side arms that you can use for support when you get dressed. What can I do in the kitchen? Clean up any spills right away. If you need to reach something above you, use a sturdy step stool that has a grab bar. Keep electrical cables out of the way. Do not use floor polish or wax that makes floors slippery. What can I do with my stairs? Do not leave anything on the stairs. Make sure that you have a light switch at the top and the bottom of the stairs. Have them installed if you do not have them. Make sure that there are handrails on both sides of the stairs. Fix handrails that are broken or loose. Make sure that handrails are as long as the staircases. Install non-slip stair treads on all stairs in your home if they do not have carpet. Avoid having throw rugs at the top or bottom of stairs, or secure the rugs with carpet tape to prevent them from moving. Choose a carpet design that does not hide the edge of steps on the stairs. Make sure that carpet is firmly attached to the stairs. Fix any carpet that is loose or worn. What can I do on the outside of my home? Use bright outdoor lighting. Repair the edges of walkways and driveways and fix any cracks. Clear paths of anything that can make you trip, such as tools or rocks. Add color or contrast paint or tape to clearly mark and help you see high doorway thresholds. Trim any bushes or trees on the main path into your home. Check that handrails are securely fastened and in good repair. Both sides of all steps should have  handrails. Install guardrails along the edges of any raised decks or porches. Have leaves, snow, and ice cleared regularly. Use sand, salt, or ice melt on walkways during winter months if you live where there is ice and snow. In the garage, clean up any spills right away, including grease or oil spills. What other actions can I take? Review your medicines with your health care provider. Some medicines can make you confused or feel dizzy. This can increase your chance of falling. Wear closed-toe shoes that fit well and support your feet. Wear shoes that have rubber soles and low heels. Use a cane, walker,  scooter, or crutches that help you move around if needed. Talk with your provider about other ways that you can decrease your risk of falls. This may include seeing a physical therapist to learn to do exercises to improve movement and strength. Where to find more information Centers for Disease Control and Prevention, [employer]: TonerPromos.no [employer] on Aging: BaseRingTones.pl National Institute on Aging: BaseRingTones.pl Contact a health care provider if: You are afraid of falling at home. You feel weak, drowsy, or dizzy at home. You fall at home. Get help right away if you: Lose consciousness or have trouble moving after a fall. Have a fall that causes a head injury. These symptoms may be an emergency. Get help right away. Call 911. Do not wait to see if the symptoms will go away. Do not drive yourself to the hospital. This information is not intended to replace advice given to you by your health care provider. Make sure you discuss any questions you have with your health care provider. Document Revised: 12/11/2021 Document Reviewed: 12/11/2021 Elsevier Patient Education  2024 [employer].

## 2023-09-25 NOTE — Progress Notes (Signed)
 Subjective:   Angela Herrera is a 73 y.o. who presents for a Medicare Wellness preventive visit.  As a reminder, Annual Wellness Visits don't include a physical exam, and some assessments may be limited, especially if this visit is performed virtually. We may recommend an in-person follow-up visit with your provider if needed.  Visit Complete: Virtual I connected with  Angela Herrera on 09/25/23 by a audio enabled telemedicine application and verified that I am speaking with the correct person using two identifiers.  Patient Location: Home  Provider Location: Home Office  I discussed the limitations of evaluation and management by telemedicine. The patient expressed understanding and agreed to proceed.  Vital Signs: Because this visit was a virtual/telehealth visit, some criteria may be missing or patient reported. Any vitals not documented were not able to be obtained and vitals that have been documented are patient reported.  VideoDeclined- This patient declined Librarian, academic. Therefore the visit was completed with audio only.  Persons Participating in Visit: Patient.  AWV Questionnaire: No: Patient Medicare AWV questionnaire was not completed prior to this visit.  Cardiac Risk Factors include: advanced age (>61men, >38 women);dyslipidemia;hypertension;smoking/ tobacco exposure     Objective:     Today's Vitals   09/25/23 0933  BP: 138/79  Pulse: 70  Weight: 136 lb (61.7 kg)  Height: 5\' 4"  (1.626 m)   Body mass index is 23.34 kg/m.     09/25/2023    9:46 AM 05/14/2022    9:13 AM 03/22/2021    8:49 AM 03/21/2020    8:52 AM 03/16/2019    8:56 AM 03/10/2018    8:36 AM 02/27/2018    8:56 AM  Advanced Directives  Does Patient Have a Medical Advance Directive? No No No Yes No Yes No  Type of Advance Directive    Living will  Healthcare Power of [place];Living will   Copy of Healthcare Power of Attorney in Chart?      No - copy  requested   Would patient like information on creating a medical advance directive? No - Patient declined No - Patient declined Yes (MAU/Ambulatory/Procedural Areas - Information given)  Yes (MAU/Ambulatory/Procedural Areas - Information given)      Current Medications (verified) Outpatient Encounter Medications as of 09/25/2023  Medication Sig   amLODipine  (NORVASC ) 5 MG tablet TAKE 1 TABLET BY MOUTH EVERY DAY   aspirin 81 MG tablet Take 81 mg by mouth daily.   cholecalciferol (VITAMIN D3) 25 MCG (1000 UT) tablet Take 1,000 Units by mouth daily.   lisinopril  (ZESTRIL ) 10 MG tablet TAKE 1 TABLET BY MOUTH EVERY DAY   Multiple Vitamins-Minerals (MULTIVITAMIN WITH MINERALS) tablet Take 1 tablet daily by mouth.   omeprazole  (PRILOSEC) 40 MG capsule TAKE 1 CAPSULE BY MOUTH EVERY DAY   rosuvastatin  (CRESTOR ) 10 MG tablet TAKE 1 TABLET BY MOUTH EVERY DAY   No facility-administered encounter medications on file as of 09/25/2023.    Allergies (verified) Tetracycline   History: Past Medical History:  Diagnosis Date   Appetite impaired 09/22/2019   GERD (gastroesophageal reflux disease)    Hyperlipidemia    Hypertension    Peripheral vascular disease (HCC)    right leg   Tobacco use disorder 02/23/2015   Past Surgical History:  Procedure Laterality Date   COLONOSCOPY  08/05/2006   normal   COLONOSCOPY WITH PROPOFOL  N/A 06/12/2017   Procedure: COLONOSCOPY WITH PROPOFOL ;  Surgeon: Selena Daily, MD;  Location: [facility];  Service: Endoscopy;  Laterality: N/A;   TUBAL LIGATION Bilateral    Family History  Problem Relation Age of Onset   Hypertension Mother    Stroke Mother    Other Father        "natural causes"   Breast cancer Neg Hx    Social History   Socioeconomic History   Marital status: Married    Spouse name: Davanna He   Number of children: 0   Years of education: Not on file   Highest education level: Some college, no degree  Occupational History    Occupation: retired  Tobacco Use   Smoking status: Every Day    Current packs/day: 0.75    Average packs/day: 0.5 packs/day for 59.7 years (30.8 ttl pk-yrs)    Types: Cigarettes    Start date: 1965    Last attempt to quit: 02/22/2019    Passive exposure: Past   Smokeless tobacco: Never   Tobacco comments:    since age 83; quit 02/2019 after dental work; started smoking again 11/2019; smoking .75 PPD 03/22/21  Vaping Use   Vaping status: Never Used  Substance and Sexual Activity   Alcohol use: Not Currently    Alcohol/week: 2.0 standard drinks of alcohol    Types: 2 Glasses of wine per week    Comment: 2 glasses of wine on the weekends (2-3 times per month)_   Drug use: No   Sexual activity: Yes  Other Topics Concern   Not on file  Social History Narrative   Not on file   Social Drivers of Health   Financial Resource Strain: Low Risk  (09/25/2023)   Overall Financial Resource Strain (CARDIA)    Difficulty of Paying Living Expenses: Not hard at all  Food Insecurity: No Food Insecurity (09/25/2023)   Hunger Vital Sign    Worried About Running Out of Food in the Last Year: Never true    Ran Out of Food in the Last Year: Never true  Transportation Needs: No Transportation Needs (09/25/2023)   PRAPARE - Administrator, Civil Service (Medical): No    Lack of Transportation (Non-Medical): No  Physical Activity: Inactive (09/25/2023)   Exercise Vital Sign    Days of Exercise per Week: 0 days    Minutes of Exercise per Session: 0 min  Stress: Stress Concern Present (09/25/2023)   [employer] of Occupational Health - Occupational Stress Questionnaire    Feeling of Stress : To some extent  Social Connections: Moderately Isolated (09/25/2023)   Social Connection and Isolation Panel [NHANES]    Frequency of Communication with Friends and Family: More than three times a week    Frequency of Social Gatherings with Friends and Family: Twice a week    Attends Religious Services:  Never    Database administrator or Organizations: No    Attends Banker Meetings: Never    Marital Status: Married    Tobacco Counseling Ready to quit: No Counseling given: No Tobacco comments: since age 47; quit 02/2019 after dental work; started smoking again 11/2019; smoking .75 PPD 03/22/21    Clinical Intake:  Pre-visit preparation completed: Yes  Pain : No/denies pain     BMI - recorded: 23.34 Nutritional Status: BMI of 19-24  Normal Nutritional Risks: None Diabetes: No  Lab Results  Component Value Date   HGBA1C 5.5 04/03/2021     How often do you need to have someone help you when you read instructions, pamphlets, or other written materials from your  doctor or pharmacy?: 1 - Never  Interpreter Needed?: No  Information entered by :: Jaunita Messier, CMA   Activities of Daily Living     09/25/2023    9:35 AM  In your present state of health, do you have any difficulty performing the following activities:  Hearing? 0  Vision? 0  Difficulty concentrating or making decisions? 0  Walking or climbing stairs? 0  Dressing or bathing? 0  Doing errands, shopping? 0  Preparing Food and eating ? N  Using the Toilet? N  In the past six months, have you accidently leaked urine? N  Do you have problems with loss of bowel control? N  Managing your Medications? N  Managing your Finances? N  Housekeeping or managing your Housekeeping? N    Patient Care Team: Sheron Dixons, MD as PCP - General (Internal Medicine) Rosa College, MD as Consulting Physician (Ophthalmology)  I have updated your Care Teams any recent Medical Services you may have received from other providers in the past year.     Assessment:    This is a routine wellness examination for Litzy.  Hearing/Vision screen Hearing Screening - Comments:: Denies hearing loss Vision Screening - Comments:: Gets routine eye exams, Dr Dusty Gin, [place]   Goals Addressed              This Visit's Progress    Patient Stated       Would like to travel more and be more active       Depression Screen     09/25/2023    9:42 AM 04/08/2023    8:37 AM 10/03/2022    9:08 AM 09/18/2022    8:52 AM 05/14/2022    9:05 AM 04/04/2022    8:37 AM 10/02/2021    8:15 AM  PHQ 2/9 Scores  PHQ - 2 Score 0 0 1 1 0 2 1  PHQ- 9 Score 7 6 7 5  5 4     Fall Risk     09/25/2023    9:47 AM 04/08/2023    8:37 AM 10/03/2022    9:08 AM 09/18/2022    8:52 AM 05/14/2022    9:05 AM  Fall Risk   Falls in the past year? 0 0 0 0 0  Number falls in past yr: 0 0 0 0 0  Injury with Fall? 0 0 0 0 0  Risk for fall due to : No Fall Risks No Fall Risks No Fall Risks No Fall Risks No Fall Risks  Follow up Falls evaluation completed Falls evaluation completed Falls evaluation completed Falls evaluation completed Falls evaluation completed    MEDICARE RISK AT HOME:  Medicare Risk at Home Any stairs in or around the home?: Yes If so, are there any without handrails?: No Home free of loose throw rugs in walkways, pet beds, electrical cords, etc?: Yes Adequate lighting in your home to reduce risk of falls?: Yes Life alert?: No Use of a cane, walker or w/c?: No Grab bars in the bathroom?: No Shower chair or bench in shower?: Yes Elevated toilet seat or a handicapped toilet?: No  TIMED UP AND GO:  Was the test performed?  No  Cognitive Function: 6CIT completed        09/25/2023    9:48 AM 05/14/2022    9:06 AM 03/21/2020    8:54 AM 03/10/2018    8:48 AM 02/27/2018    8:57 AM  6CIT Screen  What Year? 0 points 0 points  0 points 0 points 0 points  What month? 0 points 0 points 0 points 0 points 0 points  What time? 0 points 0 points 0 points 0 points 0 points  Count back from 20 0 points 0 points 0 points 0 points 0 points  Months in reverse 0 points 0 points 0 points 0 points 0 points  Repeat phrase 0 points 0 points 0 points 0 points 0 points  Total Score 0 points 0 points 0 points 0  points 0 points    Immunizations Immunization History  Administered Date(s) Administered   DTaP 08/31/2022   Moderna Covid-19 Fall Seasonal Vaccine 71yrs & older 01/09/2023   Moderna Covid-19 Vaccine Bivalent Booster 32yrs & up 01/24/2021   Moderna Sars-Covid-2 Vaccination 05/29/2019, 06/26/2019, 02/26/2020   PNEUMOCOCCAL CONJUGATE-20 04/04/2022   Pneumococcal Conjugate-13 02/28/2016   Pneumococcal Polysaccharide-23 09/24/2012, 02/27/2018   Zoster Recombinant(Shingrix ) 03/27/2019, 05/28/2019   Zoster, Live 12/08/2013    Screening Tests Health Maintenance  Topic Date Due   DEXA SCAN  07/09/2011   COVID-19 Vaccine (6 - 2024-25 season) 07/09/2023   INFLUENZA VACCINE  11/22/2023   MAMMOGRAM  04/03/2024   Lung Cancer Screening  04/29/2024   Medicare Annual Wellness (AWV)  09/24/2024   Colonoscopy  06/13/2027   DTaP/Tdap/Td (2 - Tdap) 08/30/2032   Pneumonia Vaccine 62+ Years old  Completed   Hepatitis C Screening  Completed   Zoster Vaccines- Shingrix   Completed   HPV VACCINES  Aged Out   Meningococcal B Vaccine  Aged Out    Health Maintenance  Health Maintenance Due  Topic Date Due   DEXA SCAN  07/09/2011   COVID-19 Vaccine (6 - 2024-25 season) 07/09/2023   Health Maintenance Items Addressed: Mammogram ordered, DEXA ordered, See Nurse Notes at the end of this note  Additional Screening:  Vision Screening: Recommended annual ophthalmology exams for early detection of glaucoma and other disorders of the eye. Would you like a referral to an eye doctor? No    Dental Screening: Recommended annual dental exams for proper oral hygiene  Community Resource Referral / Chronic Care Management: CRR required this visit?  No   CCM required this visit?  No   Plan:    I have personally reviewed and noted the following in the patient's chart:   Medical and social history Use of alcohol, tobacco or illicit drugs  Current medications and supplements including opioid  prescriptions. Patient is not currently taking opioid prescriptions. Functional ability and status Nutritional status Physical activity Advanced directives List of other physicians Hospitalizations, surgeries, and ER visits in previous 12 months Vitals Screenings to include cognitive, depression, and falls Referrals and appointments  In addition, I have reviewed and discussed with patient certain preventive protocols, quality metrics, and best practice recommendations. A written personalized care plan for preventive services as well as general preventive health recommendations were provided to patient.   Jaunita Messier, CMA   09/25/2023   After Visit Summary: (MyChart) Due to this being a telephonic visit, the after visit summary with patients personalized plan was offered to patient via MyChart   Notes: Please refer to Routing Comments.

## 2023-10-10 ENCOUNTER — Ambulatory Visit (INDEPENDENT_AMBULATORY_CARE_PROVIDER_SITE_OTHER): Admitting: Internal Medicine

## 2023-10-10 ENCOUNTER — Encounter: Payer: Self-pay | Admitting: Internal Medicine

## 2023-10-10 VITALS — BP 108/68 | HR 69 | Ht 64.0 in | Wt 126.0 lb

## 2023-10-10 DIAGNOSIS — K219 Gastro-esophageal reflux disease without esophagitis: Secondary | ICD-10-CM

## 2023-10-10 DIAGNOSIS — F5101 Primary insomnia: Secondary | ICD-10-CM | POA: Diagnosis not present

## 2023-10-10 DIAGNOSIS — I10 Essential (primary) hypertension: Secondary | ICD-10-CM

## 2023-10-10 MED ORDER — LISINOPRIL 10 MG PO TABS: 10.0000 mg | ORAL_TABLET | Freq: Every day | ORAL | 1 refills | Status: DC

## 2023-10-10 MED ORDER — AMLODIPINE BESYLATE 5 MG PO TABS: 5.0000 mg | ORAL_TABLET | Freq: Every day | ORAL | 1 refills | Status: DC

## 2023-10-10 MED ORDER — OMEPRAZOLE 40 MG PO CPDR: 40.0000 mg | DELAYED_RELEASE_CAPSULE | Freq: Every day | ORAL | 1 refills | Status: DC

## 2023-10-10 NOTE — Progress Notes (Signed)
 Date:  10/10/2023   Name:  Angela Herrera   DOB:  [birth year]   MRN:  [id number]   Chief Complaint: Hypertension  Hypertension This is a chronic problem. The problem is controlled. Pertinent negatives include no chest pain, headaches, palpitations or shortness of breath. Past treatments include ACE inhibitors and calcium  channel blockers. The current treatment provides significant improvement.  Insomnia Primary symptoms: sleep disturbance, premature morning awakening.   The onset quality is undetermined. The problem occurs nightly. The problem is unchanged. Exacerbated by: hot flashes. Treatments tried: Melatonin. The treatment provided mild relief.    Review of Systems  Constitutional:  Negative for fatigue and unexpected weight change.  HENT:  Negative for trouble swallowing.   Eyes:  Negative for visual disturbance.  Respiratory:  Negative for cough, chest tightness, shortness of breath and wheezing.   Cardiovascular:  Negative for chest pain, palpitations and leg swelling.  Gastrointestinal:  Negative for abdominal pain, constipation and diarrhea.  Musculoskeletal:  Negative for arthralgias and myalgias.  Neurological:  Negative for dizziness, weakness, light-headedness and headaches.  Psychiatric/Behavioral:  Positive for sleep disturbance. Negative for dysphoric mood. The patient has insomnia. The patient is not nervous/anxious.      Lab Results  Component Value Date   NA 141 04/08/2023   K 4.7 04/08/2023   CO2 23 04/08/2023   GLUCOSE 90 04/08/2023   BUN 12 04/08/2023   CREATININE 0.63 04/08/2023   CALCIUM  10.1 04/08/2023   EGFR 94 04/08/2023   GFRNONAA 89 03/29/2020   Lab Results  Component Value Date   CHOL 188 04/08/2023   HDL 48 04/08/2023   LDLCALC 115 (H) 04/08/2023   TRIG 141 04/08/2023   CHOLHDL 3.9 04/08/2023   Lab Results  Component Value Date   TSH 1.530 04/08/2023   Lab Results  Component Value Date   HGBA1C 5.5 04/03/2021   Lab Results   Component Value Date   WBC 5.7 04/08/2023   HGB 14.3 04/08/2023   HCT 44.8 04/08/2023   MCV 91 04/08/2023   PLT 434 04/08/2023   Lab Results  Component Value Date   ALT 17 04/08/2023   AST 18 04/08/2023   ALKPHOS 73 04/08/2023   BILITOT 0.3 04/08/2023   No results found for: Lucetta Russel, VD25OH   Patient Active Problem List   Diagnosis Date Noted   Primary insomnia 10/10/2023   Aortic atherosclerosis (HCC) 09/25/2018   Hemangioma of liver 03/24/2018   GERD without esophagitis 09/14/2015   Peripheral vascular disease (HCC) 02/23/2015   Tobacco use disorder 02/23/2015   Mixed hyperlipidemia 10/22/2014   Environmental and seasonal allergies 10/22/2014   Essential hypertension 10/22/2014    Allergies  Allergen Reactions   Tetracycline Itching    Past Surgical History:  Procedure Laterality Date   COLONOSCOPY  08/05/2006   normal   COLONOSCOPY WITH PROPOFOL  N/A 06/12/2017   Procedure: COLONOSCOPY WITH PROPOFOL ;  Surgeon: Selena Daily, MD;  Location: [facility];  Service: Endoscopy;  Laterality: N/A;   TUBAL LIGATION Bilateral     Social History   Tobacco Use   Smoking status: Every Day    Current packs/day: 0.75    Average packs/day: 0.5 packs/day for 59.7 years (30.8 ttl pk-yrs)    Types: Cigarettes    Start date: 1965    Last attempt to quit: 02/22/2019    Passive exposure: Past   Smokeless tobacco: Never   Tobacco comments:    since age 16; quit 02/2019 after dental work;  started smoking again 11/2019; smoking .75 PPD 03/22/21  Vaping Use   Vaping status: Never Used  Substance Use Topics   Alcohol use: Not Currently    Alcohol/week: 2.0 standard drinks of alcohol    Types: 2 Glasses of wine per week    Comment: 2 glasses of wine on the weekends (2-3 times per month)_   Drug use: No     Medication list has been reviewed and updated.  Current Meds  Medication Sig   aspirin 81 MG tablet Take 81 mg by mouth daily.    cholecalciferol (VITAMIN D3) 25 MCG (1000 UT) tablet Take 1,000 Units by mouth daily.   Multiple Vitamins-Minerals (MULTIVITAMIN WITH MINERALS) tablet Take 1 tablet daily by mouth.   rosuvastatin  (CRESTOR ) 10 MG tablet TAKE 1 TABLET BY MOUTH EVERY DAY   [DISCONTINUED] amLODipine  (NORVASC ) 5 MG tablet TAKE 1 TABLET BY MOUTH EVERY DAY   [DISCONTINUED] lisinopril  (ZESTRIL ) 10 MG tablet TAKE 1 TABLET BY MOUTH EVERY DAY   [DISCONTINUED] omeprazole  (PRILOSEC) 40 MG capsule TAKE 1 CAPSULE BY MOUTH EVERY DAY       10/10/2023    9:22 AM 04/08/2023    8:38 AM 10/03/2022    9:08 AM 09/18/2022    8:52 AM  GAD 7 : Generalized Anxiety Score  Nervous, Anxious, on Edge 0 0 0 0  Control/stop worrying 1 0 0 0  Worry too much - different things 1 0 1 1  Trouble relaxing 2 0 0 1  Restless 1 0 1 1  Easily annoyed or irritable 1 0 1 1  Afraid - awful might happen 0 0 0 1  Total GAD 7 Score 6 0 3 5  Anxiety Difficulty Not difficult at all Not difficult at all Not difficult at all Not difficult at all       10/10/2023    9:22 AM 09/25/2023    9:42 AM 04/08/2023    8:37 AM  Depression screen PHQ 2/9  Decreased Interest 0 0 0  Down, Depressed, Hopeless 1 0 0  PHQ - 2 Score 1 0 0  Altered sleeping 2 3 3   Tired, decreased energy 2 3 1   Change in appetite 1 1 2   Feeling bad or failure about yourself  0 0 0  Trouble concentrating 1 0 0  Moving slowly or fidgety/restless 1 0 0  Suicidal thoughts 0 0 0  PHQ-9 Score 8 7 6   Difficult doing work/chores Not difficult at all Not difficult at all Not difficult at all    BP Readings from Last 3 Encounters:  10/10/23 108/68  09/25/23 138/79  04/08/23 112/64    Physical Exam Vitals and nursing note reviewed.  Constitutional:      General: She is not in acute distress.    Appearance: She is well-developed.  HENT:     Head: Normocephalic and atraumatic.  Neck:     Vascular: No carotid bruit.   Cardiovascular:     Rate and Rhythm: Normal rate and  regular rhythm.     Heart sounds: No murmur heard. Pulmonary:     Effort: Pulmonary effort is normal. No respiratory distress.     Breath sounds: No wheezing or rhonchi.   Musculoskeletal:     Right lower leg: No edema.     Left lower leg: No edema.  Lymphadenopathy:     Cervical: No cervical adenopathy.   Skin:    General: Skin is warm and dry.     Capillary Refill: Capillary refill  takes less than 2 seconds.     Findings: No rash.   Neurological:     General: No focal deficit present.     Mental Status: She is alert and oriented to person, place, and time.   Psychiatric:        Mood and Affect: Mood normal.        Behavior: Behavior normal.     Wt Readings from Last 3 Encounters:  10/10/23 126 lb (57.2 kg)  09/25/23 136 lb (61.7 kg)  04/08/23 133 lb (60.3 kg)    BP 108/68   Pulse 69   Ht 5' 4 (1.626 m)   Wt 126 lb (57.2 kg)   SpO2 98%   BMI 21.63 kg/m   Assessment and Plan:  Problem List Items Addressed This Visit       Unprioritized   Essential hypertension - Primary (Chronic)   Blood pressure is well controlled.  Current medications on lisinopril  and amlodipine   Will continue same regimen along with efforts to limit dietary sodium.       Relevant Medications   amLODipine  (NORVASC ) 5 MG tablet   lisinopril  (ZESTRIL ) 10 MG tablet   GERD without esophagitis   Reflux well controlled on daily omeprazole .      Relevant Medications   omeprazole  (PRILOSEC) 40 MG capsule   Primary insomnia   Recommend trial of benadryl 25 mg at bedtime Sleep hygiene discussed.      Other Visit Diagnoses       Gastroesophageal reflux disease, unspecified whether esophagitis present       Relevant Medications   omeprazole  (PRILOSEC) 40 MG capsule      She is reminded to schedule her DEXA and Mammogram.  Number provided.  Return in about 6 months (around 04/10/2024) for CPX.    Sheron Dixons, MD [facility]  [place]

## 2023-10-10 NOTE — Patient Instructions (Addendum)
 Call [facility] to schedule your mammogram and Bone Density at [phone].  Try  Benadryl 25 mg at bedtime to aid with sleep.

## 2023-10-10 NOTE — Assessment & Plan Note (Signed)
 Reflux well controlled on daily omeprazole .

## 2023-10-10 NOTE — Assessment & Plan Note (Signed)
 Recommend trial of benadryl 25 mg at bedtime Sleep hygiene discussed.

## 2023-10-10 NOTE — Assessment & Plan Note (Signed)
 Blood pressure is well controlled.  Current medications on lisinopril  and amlodipine   Will continue same regimen along with efforts to limit dietary sodium.

## 2023-11-25 ENCOUNTER — Ambulatory Visit (INDEPENDENT_AMBULATORY_CARE_PROVIDER_SITE_OTHER): Admitting: Student

## 2023-11-25 ENCOUNTER — Encounter: Payer: Self-pay | Admitting: Student

## 2023-11-25 VITALS — BP 138/76 | HR 103 | Ht 64.0 in | Wt 127.2 lb

## 2023-11-25 DIAGNOSIS — R221 Localized swelling, mass and lump, neck: Secondary | ICD-10-CM

## 2023-11-25 NOTE — Progress Notes (Signed)
 Established Patient Office Visit  Subjective   Patient ID: Angela Herrera, female    DOB: 04/04/1951  Age: 73 y.o. MRN: [id number]  Chief Complaint  Patient presents with   Mass    pea size lump located under her chin, sore, not painful, patient states size has not changed, first noticed it on Tuesday of last week   Last Tuesday was picking up tree branches when a branch hit the bottom on her. Had some soreness under the chin after this, then she started feeling a small bump under chin 2 days later. Soreness has improved but continue to have a small knot in the area. No change in the size of the mass. Does not feel the mass was present prior to hitting her chin. She fevers, recent illness, no dysphagia,  dry mouth, heat or cold intolerance, or change in voice. Smokes 1ppd since she was a teenager, drinks alcohol occasionally.    Patient Active Problem List   Diagnosis Date Noted   Primary insomnia 10/10/2023   Aortic atherosclerosis (HCC) 09/25/2018   Hemangioma of liver 03/24/2018   GERD without esophagitis 09/14/2015   Peripheral vascular disease (HCC) 02/23/2015   Tobacco use disorder 02/23/2015   Mixed hyperlipidemia 10/22/2014   Environmental and seasonal allergies 10/22/2014   Essential hypertension 10/22/2014      ROS Refer to HPI    Objective:     BP 138/76   Pulse (!) 103   Ht 5' 4 (1.626 m)   Wt 127 lb 4 oz (57.7 kg)   SpO2 96%   BMI 21.84 kg/m  BP Readings from Last 3 Encounters:  11/25/23 138/76  10/10/23 108/68  09/25/23 138/79    Physical Exam Constitutional:      Appearance: Normal appearance.  HENT:     Mouth/Throat:     Mouth: No oral lesions.     Tongue: No lesions.     Palate: No mass and lesions.  Neck:     Thyroid : No thyroid  mass, thyromegaly or thyroid  tenderness.     Comments: 0.5 irregular mobile, rubbery left submental  mass, no pain on palpation.  Cardiovascular:     Rate and Rhythm: Normal rate and regular rhythm.   Pulmonary:     Effort: Pulmonary effort is normal.     Breath sounds: No rhonchi or rales.  Abdominal:     General: Abdomen is flat. Bowel sounds are normal. There is no distension.     Palpations: Abdomen is soft.     Tenderness: There is no abdominal tenderness.  Musculoskeletal:        General: Normal range of motion.     Right lower leg: No edema.     Left lower leg: No edema.  Lymphadenopathy:     Cervical: No cervical adenopathy.  Skin:    General: Skin is warm and dry.     Capillary Refill: Capillary refill takes less than 2 seconds.  Neurological:     General: No focal deficit present.     Mental Status: She is alert and oriented to person, place, and time.  Psychiatric:        Mood and Affect: Mood normal.        Behavior: Behavior normal.        11/25/2023    1:20 PM 10/10/2023    9:22 AM 09/25/2023    9:42 AM  Depression screen PHQ 2/9  Decreased Interest 1 0 0  Down, Depressed, Hopeless 0 1 0  PHQ -  2 Score 1 1 0  Altered sleeping 2 2 3   Tired, decreased energy 2 2 3   Change in appetite 1 1 1   Feeling bad or failure about yourself  0 0 0  Trouble concentrating 0 1 0  Moving slowly or fidgety/restless 0 1 0  Suicidal thoughts 0 0 0  PHQ-9 Score 6 8 7   Difficult doing work/chores Not difficult at all Not difficult at all Not difficult at all       11/25/2023    1:20 PM 10/10/2023    9:22 AM 04/08/2023    8:38 AM 10/03/2022    9:08 AM  GAD 7 : Generalized Anxiety Score  Nervous, Anxious, on Edge 1 0 0 0  Control/stop worrying 0 1 0 0  Worry too much - different things 1 1 0 1  Trouble relaxing 1 2 0 0  Restless 1 1 0 1  Easily annoyed or irritable 1 1 0 1  Afraid - awful might happen 0 0 0 0  Total GAD 7 Score 5 6 0 3  Anxiety Difficulty Not difficult at all Not difficult at all Not difficult at all Not difficult at all    No results found for any visits on 11/25/23.  Last CBC Lab Results  Component Value Date   WBC 5.7 04/08/2023   HGB 14.3  04/08/2023   HCT 44.8 04/08/2023   MCV 91 04/08/2023   MCH 29.1 04/08/2023   RDW 13.5 04/08/2023   PLT 434 04/08/2023   Last metabolic panel Lab Results  Component Value Date   GLUCOSE 90 04/08/2023   NA 141 04/08/2023   K 4.7 04/08/2023   CL 103 04/08/2023   CO2 23 04/08/2023   BUN 12 04/08/2023   CREATININE 0.63 04/08/2023   EGFR 94 04/08/2023   CALCIUM  10.1 04/08/2023   PROT 7.4 04/08/2023   ALBUMIN 4.3 04/08/2023   LABGLOB 3.1 04/08/2023   AGRATIO 1.4 04/04/2022   BILITOT 0.3 04/08/2023   ALKPHOS 73 04/08/2023   AST 18 04/08/2023   ALT 17 04/08/2023      The 10-year ASCVD risk score (Arnett DK, et al., 2019) is: 27.4%    Assessment & Plan:  Neck mass Patient presents today for a left sides submental mass that has been present for the past 4 days. May be related to recent trauma however given significant smoking history, tachycardia, and irregularity of the mass will order US , TSH and CBC.  -     CBC with Differential/Platelet -     TSH -     US  SOFT TISSUE HEAD & NECK (NON-THYROID ); Future     No follow-ups on file.    Harlene Saddler, MD

## 2023-11-26 ENCOUNTER — Ambulatory Visit: Payer: Self-pay | Admitting: Student

## 2023-11-26 DIAGNOSIS — R221 Localized swelling, mass and lump, neck: Secondary | ICD-10-CM

## 2023-11-26 LAB — CBC WITH DIFFERENTIAL/PLATELET
Basophils Absolute: 0 x10E3/uL (ref 0.0–0.2)
Basos: 0 %
EOS (ABSOLUTE): 0.1 x10E3/uL (ref 0.0–0.4)
Eos: 1 %
Hematocrit: 45.3 % (ref 34.0–46.6)
Hemoglobin: 14.5 g/dL (ref 11.1–15.9)
Immature Grans (Abs): 0 x10E3/uL (ref 0.0–0.1)
Immature Granulocytes: 0 %
Lymphocytes Absolute: 2 x10E3/uL (ref 0.7–3.1)
Lymphs: 21 %
MCH: 29.1 pg (ref 26.6–33.0)
MCHC: 32 g/dL (ref 31.5–35.7)
MCV: 91 fL (ref 79–97)
Monocytes Absolute: 0.8 x10E3/uL (ref 0.1–0.9)
Monocytes: 8 %
Neutrophils Absolute: 6.5 x10E3/uL (ref 1.4–7.0)
Neutrophils: 70 %
Platelets: 358 x10E3/uL (ref 150–450)
RBC: 4.98 x10E6/uL (ref 3.77–5.28)
RDW: 14.8 % (ref 11.7–15.4)
WBC: 9.5 x10E3/uL (ref 3.4–10.8)

## 2023-11-26 LAB — TSH: TSH: 0.792 u[IU]/mL (ref 0.450–4.500)

## 2023-11-28 ENCOUNTER — Ambulatory Visit
Admission: RE | Admit: 2023-11-28 | Discharge: 2023-11-28 | Disposition: A | Discharge status: Home / Self Care | Source: Ambulatory Visit | Attending: Internal Medicine | Admitting: Internal Medicine

## 2023-11-28 ENCOUNTER — Other Ambulatory Visit

## 2023-11-28 DIAGNOSIS — R221 Localized swelling, mass and lump, neck: Secondary | ICD-10-CM | POA: Insufficient documentation

## 2023-12-05 ENCOUNTER — Ambulatory Visit
Admission: RE | Admit: 2023-12-05 | Discharge: 2023-12-05 | Disposition: A | Discharge status: Home / Self Care | Source: Ambulatory Visit | Attending: Student | Admitting: Student

## 2023-12-05 DIAGNOSIS — R221 Localized swelling, mass and lump, neck: Secondary | ICD-10-CM | POA: Diagnosis present

## 2023-12-05 MED ORDER — IOHEXOL 300 MG/ML  SOLN
75.0000 mL | Freq: Once | INTRAMUSCULAR | Status: AC | PRN
  Administered 2023-12-05: 75 mL via INTRAVENOUS

## 2023-12-05 MED ORDER — SODIUM CHLORIDE 0.9 % IV SOLN: INTRAVENOUS | Status: DC

## 2023-12-18 ENCOUNTER — Ambulatory Visit: Payer: Self-pay | Admitting: Student

## 2024-01-27 ENCOUNTER — Encounter: Payer: Self-pay | Admitting: Family Medicine

## 2024-01-27 ENCOUNTER — Ambulatory Visit: Admitting: Family Medicine

## 2024-01-27 VITALS — BP 114/60 | HR 91 | Ht 64.0 in | Wt 127.0 lb

## 2024-01-27 DIAGNOSIS — S8981XA Other specified injuries of right lower leg, initial encounter: Secondary | ICD-10-CM | POA: Diagnosis not present

## 2024-01-28 DIAGNOSIS — S8981XA Other specified injuries of right lower leg, initial encounter: Secondary | ICD-10-CM | POA: Insufficient documentation

## 2024-01-28 NOTE — Patient Instructions (Signed)
 VISIT SUMMARY:  You visited us  today due to right foot pain after a frozen chicken fell on it. The pain is due to a bone and soft tissue bruise, with some mild swelling and a burning sensation likely from nerve irritation.  YOUR PLAN:  RIGHT DISTAL TIBIA BONE CONTUSION AND SOFT TISSUE CONTUSION: You have a bruise on your bone and soft tissue from the trauma, causing mild swelling and a localized burning sensation. -Monitor for signs of infection such as increased pain or swelling. -Stop using topical treatments; the scab will protect the area. -Apply ice for 20 minutes as needed to reduce swelling. -Report any new symptoms for further evaluation and possible x-ray.

## 2024-01-28 NOTE — Assessment & Plan Note (Signed)
 History of Present Illness Angela Herrera is a 73 year old female who presents with right foot pain following trauma from a frozen chicken.  Right foot pain following trauma - Sustained trauma to the right foot on September 27 when a frozen chicken fell on it - Soreness to palpation at the area of impact - Burning sensation localized to the area of trauma, which subsides with cessation of movement - Burning sensation sometimes triggered when legs are crossed - No significant pain or difficulty with ambulation - No interference with daily activities - No issues with weight-bearing activities - Able to perform heel raises independently  Self-management and wound care - Applies ice to the affected area - Uses Neosporin and topical antibacterial ointment to prevent infection  Physical Exam INSPECTION: Right inferior tibia distal third inframedial 1x1 cm eschar, mild swelling, and faint resolving surrounding ecchymosis. PALPATION: Tenderness at distal third inferior tibia at site of contusion. RANGE OF MOTION: Resisted dorsiflexion and plantar flexion elicit burning sensation resolving with cessation of motion. Able to independently heel raise on right. Ambulates without issues.  Assessment and Plan Right distal tibia bone contusion and soft tissue contusion Contusion secondary to trauma with mild swelling and resolving ecchymosis. Burning sensation likely due to local nerve irritation. Bone bruise may take up to three months to heal. - Monitor for infection signs: increased pain or swelling. - Discontinue topical treatments; scab provides protection. - Apply ice for 20 minutes as needed for swelling. - Report new symptoms for further evaluation and potential x-ray.

## 2024-01-28 NOTE — Progress Notes (Signed)
     Primary Care / Sports Medicine Office Visit  Patient Information:  Patient ID: Angela Herrera, female DOB: [birth year] Age: 73 y.o. MRN: [id number]   Angela Herrera is a pleasant 73 y.o. female presenting with the following:  Chief Complaint  Patient presents with   Ankle Injury    Right ankle injury 01/18/24. Patient was getting a whole frozen chicken out of her freezer when the chicken fell on her lower right leg. It is tender to touch and swollen. Patient has used ice therapy and using neosporin on the abrasion. No xray's.    Vitals:   01/27/24 1312  BP: 114/60  Pulse: 91  SpO2: 99%   Vitals:   01/27/24 1312  Weight: 127 lb (57.6 kg)  Height: 5' 4 (1.626 m)   Body mass index is 21.8 kg/m.  No results found.   Discussed the use of AI scribe software for clinical note transcription with the patient, who gave verbal consent to proceed.   Independent interpretation of notes and tests performed by another provider:   None  Procedures performed:   None  Pertinent History, Exam, Impression, and Recommendations:   Problem List Items Addressed This Visit     Contusion of right tibia - Primary   History of Present Illness Angela Herrera is a 73 year old female who presents with right foot pain following trauma from a frozen chicken.  Right foot pain following trauma - Sustained trauma to the right foot on September 27 when a frozen chicken fell on it - Soreness to palpation at the area of impact - Burning sensation localized to the area of trauma, which subsides with cessation of movement - Burning sensation sometimes triggered when legs are crossed - No significant pain or difficulty with ambulation - No interference with daily activities - No issues with weight-bearing activities - Able to perform heel raises independently  Self-management and wound care - Applies ice to the affected area - Uses Neosporin and topical antibacterial ointment to prevent  infection  Physical Exam INSPECTION: Right inferior tibia distal third inframedial 1x1 cm eschar, mild swelling, and faint resolving surrounding ecchymosis. PALPATION: Tenderness at distal third inferior tibia at site of contusion. RANGE OF MOTION: Resisted dorsiflexion and plantar flexion elicit burning sensation resolving with cessation of motion. Able to independently heel raise on right. Ambulates without issues.  Assessment and Plan Right distal tibia bone contusion and soft tissue contusion Contusion secondary to trauma with mild swelling and resolving ecchymosis. Burning sensation likely due to local nerve irritation. Bone bruise may take up to three months to heal. - Monitor for infection signs: increased pain or swelling. - Discontinue topical treatments; scab provides protection. - Apply ice for 20 minutes as needed for swelling. - Report new symptoms for further evaluation and potential x-ray.        Orders & Medications Medications: No orders of the defined types were placed in this encounter.  No orders of the defined types were placed in this encounter.    No follow-ups on file.     Selinda JINNY Ku, MD, [facility]   Primary Care Sports Medicine Primary Care and Sports Medicine at [facility]

## 2024-03-20 ENCOUNTER — Ambulatory Visit
Admission: EM | Admit: 2024-03-20 | Discharge: 2024-03-20 | Disposition: A | Discharge status: Home / Self Care | Attending: Family Medicine | Admitting: Family Medicine

## 2024-03-20 ENCOUNTER — Encounter: Payer: Self-pay | Admitting: Emergency Medicine

## 2024-03-20 DIAGNOSIS — R040 Epistaxis: Secondary | ICD-10-CM

## 2024-03-20 NOTE — ED Triage Notes (Signed)
 Pt c/o nose bleed. She states it started yesterday and has been intermittent. She states it is just the left side. She states she has been congestion on the left side and she blows her nose and that's when her nose bleeds. Her nose is currently bleeding but controlled.

## 2024-03-20 NOTE — Discharge Instructions (Addendum)
 Use a saline nasal spray as well as a humidifier at night to keep the nasal passages moist.  Avoid blowing your nose if you can.  You can also use Afrin over-the-counter nasal spray to help stop the bleeding as this is a vasoconstrictor.  Please go to the ER if you have any bleeding that you are unable to control.  I would recommend you follow-up with ear nose and throat if the symptoms persist.  I hope you feel better soon!

## 2024-03-20 NOTE — ED Provider Notes (Signed)
 [facility]    CSN: [id number] Arrival date & time: 03/20/24  1655      History   Chief Complaint Chief Complaint  Patient presents with   Epistaxis    HPI Angela Herrera is a 73 y.o. female presents for nosebleed.  Patient reports since yesterday she been having intermittent nosebleeds from the left nostril.  She does states she has been congested and blowing her nose frequently.  Denies any trauma to the nose.  Takes 81 mg of aspirin otherwise no other blood thinning medications.  Has had brief nosebleeds in the past.  She has been applying pressure which seems to stop the bleeding.  No other concerns at this time.   Epistaxis   Past Medical History:  Diagnosis Date   Appetite impaired 09/22/2019   GERD (gastroesophageal reflux disease)    Hyperlipidemia    Hypertension    Peripheral vascular disease    right leg   Tobacco use disorder 02/23/2015    Patient Active Problem List   Diagnosis Date Noted   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   Primary insomnia 10/10/2023   Aortic atherosclerosis 09/25/2018   Hemangioma of liver 03/24/2018   GERD without esophagitis 09/14/2015   Peripheral vascular disease 02/23/2015   Tobacco use disorder 02/23/2015   Mixed hyperlipidemia 10/22/2014   Environmental and seasonal allergies 10/22/2014   Essential hypertension 10/22/2014    Past Surgical History:  Procedure Laterality Date   COLONOSCOPY  08/05/2006   normal   COLONOSCOPY WITH PROPOFOL  N/A 06/12/2017   Procedure: COLONOSCOPY WITH PROPOFOL ;  Surgeon: Unk Corinn Skiff, MD;  Location: [facility];  Service: Endoscopy;  Laterality: N/A;   TUBAL LIGATION Bilateral     OB History   No obstetric history on file.      Home Medications    Prior to Admission medications   Medication Sig Start Date End Date Taking? Authorizing Provider  amLODipine  (NORVASC ) 5 MG tablet Take 1 tablet (5 mg total) by mouth daily. 10/10/23  Yes Justus Leita DEL, MD   aspirin 81 MG tablet Take 81 mg by mouth daily.   Yes [provider]  cholecalciferol (VITAMIN D3) 25 MCG (1000 UT) tablet Take 1,000 Units by mouth daily.   Yes [provider]  lisinopril  (ZESTRIL ) 10 MG tablet Take 1 tablet (10 mg total) by mouth daily. 10/10/23  Yes Justus Leita DEL, MD  Multiple Vitamins-Minerals (MULTIVITAMIN WITH MINERALS) tablet Take 1 tablet daily by mouth.   Yes [provider]  omeprazole  (PRILOSEC) 40 MG capsule Take 1 capsule (40 mg total) by mouth daily. 10/10/23  Yes Justus Leita DEL, MD  rosuvastatin  (CRESTOR ) 10 MG tablet TAKE 1 TABLET BY MOUTH EVERY DAY 08/06/23  Yes Justus Leita DEL, MD    Family History Family History  Problem Relation Age of Onset   Hypertension Mother    Stroke Mother    Other Father        natural causes   Breast cancer Neg Hx     Social History Social History   Tobacco Use   Smoking status: Every Day    Current packs/day: 0.75    Average packs/day: 0.5 packs/day for 60.2 years (31.2 ttl pk-yrs)    Types: Cigarettes    Start date: 1965    Last attempt to quit: 02/22/2019    Passive exposure: Past   Smokeless tobacco: Never   Tobacco comments:    since age 29; quit 02/2019 after dental work; started smoking  again 11/2019; smoking .75 PPD 03/22/21  Vaping Use   Vaping status: Never Used  Substance Use Topics   Alcohol use: Not Currently    Alcohol/week: 2.0 standard drinks of alcohol    Types: 2 Glasses of wine per week    Comment: 2 glasses of wine on the weekends (2-3 times per month)_   Drug use: No     Allergies   Tetracycline   Review of Systems Review of Systems  HENT:  Positive for nosebleeds.      Physical Exam Triage Vital Signs ED Triage Vitals  Encounter Vitals Group     BP 03/20/24 1712 (!) 165/90     Girls Systolic BP Percentile --      Girls Diastolic BP Percentile --      Boys Systolic BP Percentile --      Boys Diastolic BP Percentile --      Pulse Rate  03/20/24 1712 86     Resp 03/20/24 1712 16     Temp 03/20/24 1712 98.3 F (36.8 C)     Temp Source 03/20/24 1712 Oral     SpO2 03/20/24 1712 94 %     Weight 03/20/24 1711 126 lb 15.8 oz (57.6 kg)     Height 03/20/24 1711 5' 4 (1.626 m)     Head Circumference --      Peak Flow --      Pain Score 03/20/24 1711 0     Pain Loc --      Pain Education --      Exclude from Growth Chart --    No data found.  Updated Vital Signs BP (!) 165/90 (BP Location: Right Arm)   Pulse 86   Temp 98.3 F (36.8 C) (Oral)   Resp 16   Ht 5' 4 (1.626 m)   Wt 126 lb 15.8 oz (57.6 kg)   SpO2 94%   BMI 21.80 kg/m   Visual Acuity Right Eye Distance:   Left Eye Distance:   Bilateral Distance:    Right Eye Near:   Left Eye Near:    Bilateral Near:     Physical Exam Vitals and nursing note reviewed.  Constitutional:      General: She is not in acute distress.    Appearance: Normal appearance. She is not ill-appearing.  HENT:     Head: Normocephalic and atraumatic.     Nose: Congestion present. No nasal deformity, septal deviation, signs of injury or nasal tenderness.     Left Nostril: No foreign body, epistaxis, septal hematoma or occlusion.     Left Turbinates: Swollen.     Comments: No active bleeding at time of evaluation Eyes:     Pupils: Pupils are equal, round, and reactive to light.  Cardiovascular:     Rate and Rhythm: Normal rate.  Pulmonary:     Effort: Pulmonary effort is normal.  Skin:    General: Skin is warm and dry.  Neurological:     General: No focal deficit present.     Mental Status: She is alert and oriented to person, place, and time.  Psychiatric:        Mood and Affect: Mood normal.        Behavior: Behavior normal.      UC Treatments / Results  Labs (all labs ordered are listed, but only abnormal results are displayed) Labs Reviewed - No data to display  EKG   Radiology No results found.  Procedures Procedures (including critical care  time)  Medications Ordered in UC Medications - No data to display  Initial Impression / Assessment and Plan / UC Course  I have reviewed the triage vital signs and the nursing notes.  Pertinent labs & imaging results that were available during my care of the patient were reviewed by me and considered in my medical decision making (see chart for details).     Reviewed exam and symptoms with patient.  Recommended saline nasal spray and humidifier at night to help moisturize the nasal passages.  Also suggested over-the-counter Afrin to help with nosebleeds occurs.  Advised to avoid blowing nose if she can.  Advised PCP or ENT follow-up if symptoms persist.  She was instructed to go to the ER for any persistent bleeding that she is unable to control at home.  Patient verbalized understanding of these instructions. Final Clinical Impressions(s) / UC Diagnoses   Final diagnoses:  Epistaxis     Discharge Instructions      Use a saline nasal spray as well as a humidifier at night to keep the nasal passages moist.  Avoid blowing your nose if you can.  You can also use Afrin over-the-counter nasal spray to help stop the bleeding as this is a vasoconstrictor.  Please go to the ER if you have any bleeding that you are unable to control.  I would recommend you follow-up with ear nose and throat if the symptoms persist.  I hope you feel better soon!    ED Prescriptions   None    PDMP not reviewed this encounter.   Loreda Myla SAUNDERS, NP 03/20/24 1725

## 2024-04-07 ENCOUNTER — Ambulatory Visit: Admission: RE | Admit: 2024-04-07 | Discharge: 2024-04-07 | Attending: Internal Medicine | Admitting: Internal Medicine

## 2024-04-07 ENCOUNTER — Ambulatory Visit: Payer: Self-pay | Admitting: Internal Medicine

## 2024-04-07 DIAGNOSIS — Z780 Asymptomatic menopausal state: Secondary | ICD-10-CM | POA: Diagnosis present

## 2024-04-07 DIAGNOSIS — Z1231 Encounter for screening mammogram for malignant neoplasm of breast: Secondary | ICD-10-CM

## 2024-04-10 ENCOUNTER — Ambulatory Visit: Admitting: Internal Medicine

## 2024-04-10 ENCOUNTER — Encounter: Payer: Self-pay | Admitting: Internal Medicine

## 2024-04-10 VITALS — BP 108/62 | HR 65 | Ht 64.0 in | Wt 127.0 lb

## 2024-04-10 DIAGNOSIS — K219 Gastro-esophageal reflux disease without esophagitis: Secondary | ICD-10-CM | POA: Diagnosis not present

## 2024-04-10 DIAGNOSIS — E782 Mixed hyperlipidemia: Secondary | ICD-10-CM | POA: Diagnosis not present

## 2024-04-10 DIAGNOSIS — F17200 Nicotine dependence, unspecified, uncomplicated: Secondary | ICD-10-CM

## 2024-04-10 DIAGNOSIS — I10 Essential (primary) hypertension: Secondary | ICD-10-CM | POA: Diagnosis not present

## 2024-04-10 DIAGNOSIS — Z0000 Encounter for general adult medical examination without abnormal findings: Secondary | ICD-10-CM

## 2024-04-10 DIAGNOSIS — Z1231 Encounter for screening mammogram for malignant neoplasm of breast: Secondary | ICD-10-CM

## 2024-04-10 MED ORDER — ROSUVASTATIN CALCIUM 10 MG PO TABS: 10.0000 mg | ORAL_TABLET | Freq: Every day | ORAL | 1 refills | Status: AC

## 2024-04-10 MED ORDER — AMLODIPINE BESYLATE 5 MG PO TABS: 5.0000 mg | ORAL_TABLET | Freq: Every day | ORAL | 1 refills | Status: AC

## 2024-04-10 MED ORDER — OMEPRAZOLE 40 MG PO CPDR: 40.0000 mg | DELAYED_RELEASE_CAPSULE | Freq: Every day | ORAL | 1 refills | Status: AC

## 2024-04-10 MED ORDER — LISINOPRIL 10 MG PO TABS: 10.0000 mg | ORAL_TABLET | Freq: Every day | ORAL | 1 refills | Status: AC

## 2024-04-10 NOTE — Assessment & Plan Note (Signed)
 Currently taking omeprazole  daily with minimal reflux symptoms. Patient denies red flag symptoms - no melena, weight loss, dysphagia. Will maintain current management.

## 2024-04-10 NOTE — Assessment & Plan Note (Addendum)
 Participating in lung cancer screening. She is trying to quit after she had a severe nose bleed at Thanksgiving.

## 2024-04-10 NOTE — Assessment & Plan Note (Signed)
 Well controlled blood pressure today. Current regimen is lisinopril  and amlodipine . No medication side effects noted.  Renal function normal.

## 2024-04-10 NOTE — Progress Notes (Signed)
 "   Date:  04/10/2024   Name:  DIELLA GILLINGHAM   DOB:  1950-10-08   MRN:  [id number]   Chief Complaint: Annual Exam Angela Herrera is a 73 y.o. female who presents today for her Complete Annual Exam. She feels well. She reports exercising walks some days. She reports she is sleeping fairly well. Breast complaints none.  Health Maintenance  Topic Date Due   Screening for Lung Cancer  04/29/2024   Flu Shot  07/21/2024*   COVID-19 Vaccine (6 - 2025-26 season) 09/08/2024   Medicare Annual Wellness Visit  09/24/2024   Breast Cancer Screening  04/07/2025   Colon Cancer Screening  06/13/2027   Osteoporosis screening with Bone Density Scan  04/07/2029   DTaP/Tdap/Td vaccine (2 - Tdap) 08/30/2032   Pneumococcal Vaccine for age over 40  Completed   Hepatitis C Screening  Completed   Zoster (Shingles) Vaccine  Completed   Meningitis B Vaccine  Aged Out  *Topic was postponed. The date shown is not the original due date.   Hypertension This is a chronic problem. The problem is controlled. Pertinent negatives include no chest pain, headaches, palpitations or shortness of breath. Past treatments include ACE inhibitors and calcium  channel blockers. There are no compliance problems.  There is no history of kidney disease, CAD/MI or CVA.  Gastroesophageal Reflux She reports no abdominal pain, no chest pain, no coughing or no wheezing. Pertinent negatives include no fatigue. She has tried a PPI for the symptoms.  Hyperlipidemia This is a chronic problem. The problem is controlled. Pertinent negatives include no chest pain, myalgias or shortness of breath. Current antihyperlipidemic treatment includes statins. The current treatment provides significant improvement of lipids.    Review of Systems  Constitutional:  Negative for fatigue and unexpected weight change.  HENT:  Negative for trouble swallowing.   Eyes:  Negative for visual disturbance.  Respiratory:  Negative for cough, chest tightness,  shortness of breath and wheezing.   Cardiovascular:  Negative for chest pain, palpitations and leg swelling.  Gastrointestinal:  Negative for abdominal pain, constipation and diarrhea.  Musculoskeletal:  Negative for arthralgias and myalgias.  Neurological:  Negative for dizziness, weakness, light-headedness and headaches.     Lab Results  Component Value Date   NA 141 04/08/2023   K 4.7 04/08/2023   CO2 23 04/08/2023   GLUCOSE 90 04/08/2023   BUN 12 04/08/2023   CREATININE 0.63 04/08/2023   CALCIUM  10.1 04/08/2023   EGFR 94 04/08/2023   GFRNONAA 89 03/29/2020   Lab Results  Component Value Date   CHOL 188 04/08/2023   HDL 48 04/08/2023   LDLCALC 115 (H) 04/08/2023   TRIG 141 04/08/2023   CHOLHDL 3.9 04/08/2023   Lab Results  Component Value Date   TSH 0.792 11/25/2023   Lab Results  Component Value Date   HGBA1C 5.5 04/03/2021   Lab Results  Component Value Date   WBC 9.5 11/25/2023   HGB 14.5 11/25/2023   HCT 45.3 11/25/2023   MCV 91 11/25/2023   PLT 358 11/25/2023   Lab Results  Component Value Date   ALT 17 04/08/2023   AST 18 04/08/2023   ALKPHOS 73 04/08/2023   BILITOT 0.3 04/08/2023   No results found for: MARIEN BOLLS, VD25OH   Patient Active Problem List   Diagnosis Date Noted   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   Primary insomnia 10/10/2023   Aortic atherosclerosis 09/25/2018   Hemangioma of liver 03/24/2018   GERD without  esophagitis 09/14/2015   Peripheral vascular disease 02/23/2015   Tobacco use disorder 02/23/2015   Mixed hyperlipidemia 10/22/2014   Environmental and seasonal allergies 10/22/2014   Essential hypertension 10/22/2014    Allergies[1]  Past Surgical History:  Procedure Laterality Date   COLONOSCOPY  08/05/2006   normal   COLONOSCOPY WITH PROPOFOL  N/A 06/12/2017   Procedure: COLONOSCOPY WITH PROPOFOL ;  Surgeon: Unk Corinn Skiff, MD;  Location: [facility];  Service: Endoscopy;  Laterality:  N/A;   TUBAL LIGATION Bilateral     Social History[2]   Medication list has been reviewed and updated.  Active Medications[3]     04/10/2024    8:58 AM 11/25/2023    1:20 PM 10/10/2023    9:22 AM 04/08/2023    8:38 AM  GAD 7 : Generalized Anxiety Score  Nervous, Anxious, on Edge 1 1 0 0  Control/stop worrying 1 0 1 0  Worry too much - different things 1 1 1  0  Trouble relaxing 2 1 2  0  Restless  1 1 0  Easily annoyed or irritable 2 1 1  0  Afraid - awful might happen 2 0 0 0  Total GAD 7 Score  5 6 0  Anxiety Difficulty  Not difficult at all Not difficult at all Not difficult at all       04/10/2024    8:58 AM 01/27/2024    1:16 PM 11/25/2023    1:20 PM  Depression screen PHQ 2/9  Decreased Interest 1 0 1  Down, Depressed, Hopeless  0 0  PHQ - 2 Score 1 0 1  Altered sleeping 1  2  Tired, decreased energy 1  2  Change in appetite 2  1  Feeling bad or failure about yourself  1  0  Trouble concentrating 1  0  Moving slowly or fidgety/restless 2  0  Suicidal thoughts 0  0  PHQ-9 Score 9  6   Difficult doing work/chores   Not difficult at all     Data saved with a previous flowsheet row definition    BP Readings from Last 3 Encounters:  04/10/24 108/62  03/20/24 (!) 165/90  01/27/24 114/60    Physical Exam Vitals and nursing note reviewed.  Constitutional:      General: She is not in acute distress.    Appearance: She is well-developed.  HENT:     Head: Normocephalic and atraumatic.     Right Ear: Tympanic membrane and ear canal normal.     Left Ear: Tympanic membrane and ear canal normal.     Nose:     Right Sinus: No maxillary sinus tenderness.     Left Sinus: No maxillary sinus tenderness.  Eyes:     General: No scleral icterus.       Right eye: No discharge.        Left eye: No discharge.     Conjunctiva/sclera: Conjunctivae normal.  Neck:     Thyroid : No thyromegaly.     Vascular: No carotid bruit.  Cardiovascular:     Rate and Rhythm: Normal  rate and regular rhythm.     Pulses: Normal pulses.     Heart sounds: Normal heart sounds.  Pulmonary:     Effort: Pulmonary effort is normal. No respiratory distress.     Breath sounds: No wheezing.  Abdominal:     General: Bowel sounds are normal.     Palpations: Abdomen is soft.     Tenderness: There is no abdominal tenderness.  Musculoskeletal:     Cervical back: Normal range of motion. No erythema.     Right lower leg: No edema.     Left lower leg: No edema.  Lymphadenopathy:     Cervical: No cervical adenopathy.  Skin:    General: Skin is warm and dry.     Capillary Refill: Capillary refill takes less than 2 seconds.     Findings: No rash.  Neurological:     General: No focal deficit present.     Mental Status: She is alert and oriented to person, place, and time.     Cranial Nerves: No cranial nerve deficit.     Sensory: No sensory deficit.     Deep Tendon Reflexes: Reflexes are normal and symmetric.  Psychiatric:        Attention and Perception: Attention normal.        Mood and Affect: Mood normal.     Wt Readings from Last 3 Encounters:  04/10/24 127 lb (57.6 kg)  03/20/24 126 lb 15.8 oz (57.6 kg)  01/27/24 127 lb (57.6 kg)    BP 108/62   Pulse 65   Ht 5' 4 (1.626 m)   Wt 127 lb (57.6 kg)   SpO2 98%   BMI 21.80 kg/m   Assessment and Plan:  Problem List Items Addressed This Visit       Unprioritized   Mixed hyperlipidemia (Chronic)   LDL is  Lab Results  Component Value Date   LDLCALC 115 (H) 04/08/2023    Current medication regimen is Crestor  10 mg. Goal LDL is < 70.       Relevant Medications   rosuvastatin  (CRESTOR ) 10 MG tablet   lisinopril  (ZESTRIL ) 10 MG tablet   amLODipine  (NORVASC ) 5 MG tablet   Other Relevant Orders   Lipid panel   Essential hypertension (Chronic)   Well controlled blood pressure today. Current regimen is lisinopril  and amlodipine . No medication side effects noted.  Renal function normal.        Relevant  Medications   rosuvastatin  (CRESTOR ) 10 MG tablet   lisinopril  (ZESTRIL ) 10 MG tablet   amLODipine  (NORVASC ) 5 MG tablet   Other Relevant Orders   CBC with Differential/Platelet   Comprehensive metabolic panel with GFR   TSH   Urinalysis, Routine w reflex microscopic   Tobacco use disorder (Chronic)   Participating in lung cancer screening. She is trying to quit after she had a severe nose bleed at Thanksgiving.      GERD without esophagitis   Currently taking omeprazole  daily with minimal reflux symptoms. Patient denies red flag symptoms - no melena, weight loss, dysphagia. Will maintain current management.       Relevant Medications   omeprazole  (PRILOSEC) 40 MG capsule   Other Visit Diagnoses       Annual physical exam    -  Primary   up to date on screenings and immunizations continue healthy diet; resume regular walking     Encounter for screening mammogram for breast cancer       done on 12/16 - results pending     Gastroesophageal reflux disease, unspecified whether esophagitis present       Relevant Medications   omeprazole  (PRILOSEC) 40 MG capsule       Return in about 6 months (around 10/09/2024) for HTN.    Leita HILARIO Adie, MD [facility] [place]           [1]  Allergies Allergen Reactions  Tetracycline Itching  [2]  Social History Tobacco Use   Smoking status: Every Day    Current packs/day: 0.75    Average packs/day: 0.5 packs/day for 60.2 years (31.2 ttl pk-yrs)    Types: Cigarettes    Start date: 1965    Last attempt to quit: 02/22/2019    Passive exposure: Past   Smokeless tobacco: Never   Tobacco comments:    since age 46; quit 02/2019 after dental work; started smoking again 11/2019; smoking .75 PPD 03/22/21  Vaping Use   Vaping status: Never Used  Substance Use Topics   Alcohol use: Not Currently    Alcohol/week: 2.0 standard drinks of alcohol    Types: 2 Glasses of wine per week    Comment:  2 glasses of wine on the weekends (2-3 times per month)_   Drug use: No  [3]  Current Meds  Medication Sig   aspirin 81 MG tablet Take 81 mg by mouth daily.   cholecalciferol (VITAMIN D3) 25 MCG (1000 UT) tablet Take 1,000 Units by mouth daily.   Multiple Vitamins-Minerals (MULTIVITAMIN WITH MINERALS) tablet Take 1 tablet daily by mouth.   [DISCONTINUED] amLODipine  (NORVASC ) 5 MG tablet Take 1 tablet (5 mg total) by mouth daily.   [DISCONTINUED] lisinopril  (ZESTRIL ) 10 MG tablet Take 1 tablet (10 mg total) by mouth daily.   [DISCONTINUED] omeprazole  (PRILOSEC) 40 MG capsule Take 1 capsule (40 mg total) by mouth daily.   [DISCONTINUED] rosuvastatin  (CRESTOR ) 10 MG tablet TAKE 1 TABLET BY MOUTH EVERY DAY   "

## 2024-04-10 NOTE — Assessment & Plan Note (Signed)
 LDL is  Lab Results  Component Value Date   LDLCALC 115 (H) 04/08/2023    Current medication regimen is Crestor  10 mg. Goal LDL is < 70.

## 2024-04-11 LAB — COMPREHENSIVE METABOLIC PANEL WITH GFR
ALT: 12 IU/L (ref 0–32)
AST: 18 IU/L (ref 0–40)
Albumin: 4.5 g/dL (ref 3.8–4.8)
Alkaline Phosphatase: 79 IU/L (ref 49–135)
BUN/Creatinine Ratio: 12 (ref 12–28)
BUN: 8 mg/dL (ref 8–27)
Bilirubin Total: 0.4 mg/dL (ref 0.0–1.2)
CO2: 24 mmol/L (ref 20–29)
Calcium: 10.1 mg/dL (ref 8.7–10.3)
Chloride: 105 mmol/L (ref 96–106)
Creatinine, Ser: 0.66 mg/dL (ref 0.57–1.00)
Globulin, Total: 2.7 g/dL (ref 1.5–4.5)
Glucose: 88 mg/dL (ref 70–99)
Potassium: 4.3 mmol/L (ref 3.5–5.2)
Sodium: 145 mmol/L — ABNORMAL HIGH (ref 134–144)
Total Protein: 7.2 g/dL (ref 6.0–8.5)
eGFR: 92 mL/min/1.73

## 2024-04-11 LAB — TSH: TSH: 1.85 u[IU]/mL (ref 0.450–4.500)

## 2024-04-11 LAB — URINALYSIS, ROUTINE W REFLEX MICROSCOPIC
Bilirubin, UA: NEGATIVE
Glucose, UA: NEGATIVE
Ketones, UA: NEGATIVE
Leukocytes,UA: NEGATIVE
Nitrite, UA: NEGATIVE
Protein,UA: NEGATIVE
RBC, UA: NEGATIVE
Specific Gravity, UA: 1.018 (ref 1.005–1.030)
Urobilinogen, Ur: 0.2 mg/dL (ref 0.2–1.0)
pH, UA: 6 (ref 5.0–7.5)

## 2024-04-11 LAB — CBC WITH DIFFERENTIAL/PLATELET
Basophils Absolute: 0.1 x10E3/uL (ref 0.0–0.2)
Basos: 1 %
EOS (ABSOLUTE): 0.3 x10E3/uL (ref 0.0–0.4)
Eos: 5 %
Hematocrit: 44.5 % (ref 34.0–46.6)
Hemoglobin: 14.3 g/dL (ref 11.1–15.9)
Immature Grans (Abs): 0 x10E3/uL (ref 0.0–0.1)
Immature Granulocytes: 0 %
Lymphocytes Absolute: 2.7 x10E3/uL (ref 0.7–3.1)
Lymphs: 45 %
MCH: 28.7 pg (ref 26.6–33.0)
MCHC: 32.1 g/dL (ref 31.5–35.7)
MCV: 89 fL (ref 79–97)
Monocytes Absolute: 0.6 x10E3/uL (ref 0.1–0.9)
Monocytes: 10 %
Neutrophils Absolute: 2.3 x10E3/uL (ref 1.4–7.0)
Neutrophils: 39 %
Platelets: 510 x10E3/uL — ABNORMAL HIGH (ref 150–450)
RBC: 4.99 x10E6/uL (ref 3.77–5.28)
RDW: 13.3 % (ref 11.7–15.4)
WBC: 5.8 x10E3/uL (ref 3.4–10.8)

## 2024-04-11 LAB — LIPID PANEL
Chol/HDL Ratio: 3.4 ratio (ref 0.0–4.4)
Cholesterol, Total: 174 mg/dL (ref 100–199)
HDL: 51 mg/dL
LDL Chol Calc (NIH): 101 mg/dL — ABNORMAL HIGH (ref 0–99)
Triglycerides: 124 mg/dL (ref 0–149)
VLDL Cholesterol Cal: 22 mg/dL (ref 5–40)

## 2024-04-13 ENCOUNTER — Ambulatory Visit: Payer: Self-pay | Admitting: Internal Medicine

## 2024-04-30 ENCOUNTER — Ambulatory Visit
Admission: RE | Admit: 2024-04-30 | Discharge: 2024-04-30 | Disposition: A | Discharge status: Home / Self Care | Source: Ambulatory Visit | Attending: Acute Care | Admitting: Acute Care

## 2024-04-30 DIAGNOSIS — Z87891 Personal history of nicotine dependence: Secondary | ICD-10-CM | POA: Diagnosis present

## 2024-04-30 DIAGNOSIS — Z122 Encounter for screening for malignant neoplasm of respiratory organs: Secondary | ICD-10-CM | POA: Diagnosis present

## 2024-04-30 DIAGNOSIS — F17210 Nicotine dependence, cigarettes, uncomplicated: Secondary | ICD-10-CM | POA: Diagnosis present

## 2024-05-08 ENCOUNTER — Other Ambulatory Visit: Payer: Self-pay

## 2024-05-08 DIAGNOSIS — F17210 Nicotine dependence, cigarettes, uncomplicated: Secondary | ICD-10-CM

## 2024-05-08 DIAGNOSIS — Z87891 Personal history of nicotine dependence: Secondary | ICD-10-CM

## 2024-05-08 DIAGNOSIS — Z122 Encounter for screening for malignant neoplasm of respiratory organs: Secondary | ICD-10-CM

## 2024-09-30 ENCOUNTER — Ambulatory Visit

## 2024-10-09 ENCOUNTER — Ambulatory Visit: Admitting: Student
